# Patient Record
Sex: Female | Born: 1980 | Race: White | Hispanic: No | Marital: Married | Smoking: Current every day smoker
Health system: Southern US, Community
[De-identification: ages and names within clinical notes are randomized; demographics above are authoritative.]

## PROBLEM LIST (undated history)

## (undated) DIAGNOSIS — E119 Type 2 diabetes mellitus without complications: Secondary | ICD-10-CM

## (undated) DIAGNOSIS — T4145XA Adverse effect of unspecified anesthetic, initial encounter: Secondary | ICD-10-CM

## (undated) DIAGNOSIS — F419 Anxiety disorder, unspecified: Secondary | ICD-10-CM

## (undated) DIAGNOSIS — R112 Nausea with vomiting, unspecified: Secondary | ICD-10-CM

## (undated) DIAGNOSIS — T8859XA Other complications of anesthesia, initial encounter: Secondary | ICD-10-CM

## (undated) DIAGNOSIS — Z9889 Other specified postprocedural states: Secondary | ICD-10-CM

## (undated) DIAGNOSIS — E8809 Other disorders of plasma-protein metabolism, not elsewhere classified: Secondary | ICD-10-CM

## (undated) HISTORY — PX: APPENDECTOMY: SHX54

## (undated) HISTORY — PX: CHOLECYSTECTOMY: SHX55

---
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
## 2011-08-15 ENCOUNTER — Encounter (INDEPENDENT_AMBULATORY_CARE_PROVIDER_SITE_OTHER): Payer: Self-pay

## 2012-02-22 HISTORY — PX: BACK SURGERY: SHX140

## 2012-03-27 ENCOUNTER — Emergency Department: Payer: Self-pay | Admitting: Emergency Medicine

## 2012-03-27 LAB — COMPREHENSIVE METABOLIC PANEL
Albumin: 3.8 g/dL (ref 3.4–5.0)
Alkaline Phosphatase: 90 U/L (ref 50–136)
Anion Gap: 9 (ref 7–16)
BUN: 10 mg/dL (ref 7–18)
Calcium, Total: 8.9 mg/dL (ref 8.5–10.1)
Glucose: 103 mg/dL — ABNORMAL HIGH (ref 65–99)
Potassium: 3.8 mmol/L (ref 3.5–5.1)
SGOT(AST): 20 U/L (ref 15–37)
Sodium: 136 mmol/L (ref 136–145)

## 2012-03-27 LAB — LIPASE, BLOOD: Lipase: 69 U/L — ABNORMAL LOW (ref 73–393)

## 2012-03-27 LAB — CBC WITH DIFFERENTIAL/PLATELET
Basophil #: 0.1 10*3/uL (ref 0.0–0.1)
Basophil %: 0.3 %
Eosinophil #: 0.3 10*3/uL (ref 0.0–0.7)
MCV: 69 fL — ABNORMAL LOW (ref 80–100)
Neutrophil #: 22.8 10*3/uL — ABNORMAL HIGH (ref 1.4–6.5)
RDW: 17.6 % — ABNORMAL HIGH (ref 11.5–14.5)
WBC: 29.4 10*3/uL — ABNORMAL HIGH (ref 3.6–11.0)

## 2012-03-27 LAB — URINALYSIS, COMPLETE
Blood: NEGATIVE
Hyaline Cast: 24
Nitrite: NEGATIVE
Ph: 5 (ref 4.5–8.0)
RBC,UR: <1 /HPF (ref 0–5)
Specific Gravity: 1.02 (ref 1.003–1.030)

## 2012-06-01 ENCOUNTER — Emergency Department: Payer: Self-pay | Admitting: Emergency Medicine

## 2012-09-22 ENCOUNTER — Emergency Department: Payer: Self-pay | Admitting: Emergency Medicine

## 2013-04-03 ENCOUNTER — Emergency Department: Payer: Self-pay | Admitting: Internal Medicine

## 2014-02-26 ENCOUNTER — Ambulatory Visit: Payer: Self-pay | Admitting: Family Medicine

## 2014-04-15 ENCOUNTER — Ambulatory Visit: Payer: Self-pay | Admitting: Emergency Medicine

## 2014-06-11 ENCOUNTER — Emergency Department
Admit: 2014-06-11 | Disposition: A | Discharge status: Home / Self Care | Payer: Self-pay | Admitting: Emergency Medicine

## 2014-06-11 ENCOUNTER — Emergency Department: Admit: 2014-06-11 | Discharge status: Against Medical Advice | Payer: Self-pay | Admitting: Student

## 2014-06-11 LAB — URINALYSIS, COMPLETE
BILIRUBIN, UR: NEGATIVE
Glucose,UR: NEGATIVE mg/dL (ref 0–75)
Ketone: NEGATIVE
Nitrite: NEGATIVE
Ph: 6 (ref 4.5–8.0)
Protein: NEGATIVE
Specific Gravity: 1.004 (ref 1.003–1.030)

## 2014-06-11 LAB — CBC WITH DIFFERENTIAL/PLATELET
Basophil #: 0.2 10*3/uL — ABNORMAL HIGH (ref 0.0–0.1)
Basophil %: 0.6 %
EOS PCT: 0.9 %
Eosinophil #: 0.2 10*3/uL (ref 0.0–0.7)
HCT: 38.9 % (ref 35.0–47.0)
HGB: 12.3 g/dL (ref 12.0–16.0)
LYMPHS ABS: 4.8 10*3/uL — AB (ref 1.0–3.6)
Lymphocyte %: 19.3 %
MCH: 23.5 pg — AB (ref 26.0–34.0)
MCHC: 31.6 g/dL — ABNORMAL LOW (ref 32.0–36.0)
MCV: 74 fL — ABNORMAL LOW (ref 80–100)
MONO ABS: 1.4 x10 3/mm — AB (ref 0.2–0.9)
Monocyte %: 5.7 %
NEUTROS ABS: 18.3 10*3/uL — AB (ref 1.4–6.5)
Neutrophil %: 73.5 %
PLATELETS: 449 10*3/uL — AB (ref 150–440)
RBC: 5.23 10*6/uL — ABNORMAL HIGH (ref 3.80–5.20)
RDW: 19.6 % — ABNORMAL HIGH (ref 11.5–14.5)
WBC: 24.9 10*3/uL — ABNORMAL HIGH (ref 3.6–11.0)

## 2014-06-11 LAB — BASIC METABOLIC PANEL
ANION GAP: 9 (ref 7–16)
BUN: 9 mg/dL
CREATININE: 0.71 mg/dL
Calcium, Total: 9.2 mg/dL
Chloride: 107 mmol/L
Co2: 22 mmol/L
EGFR (African American): >60
EGFR (Non-African Amer.): >60
Glucose: 105 mg/dL — ABNORMAL HIGH
POTASSIUM: 3.6 mmol/L
SODIUM: 138 mmol/L

## 2014-06-11 LAB — ETHANOL: Ethanol: <5 mg/dL

## 2014-06-11 LAB — TROPONIN I

## 2014-06-12 ENCOUNTER — Observation Stay
Admit: 2014-06-12 | Disposition: A | Discharge status: Home / Self Care | Payer: Self-pay | Attending: Internal Medicine | Admitting: Internal Medicine

## 2014-06-12 LAB — URINALYSIS, COMPLETE
Bilirubin,UR: NEGATIVE
Glucose,UR: NEGATIVE mg/dL (ref 0–75)
KETONE: NEGATIVE
Leukocyte Esterase: NEGATIVE
NITRITE: NEGATIVE
PH: 5 (ref 4.5–8.0)
Protein: NEGATIVE
SPECIFIC GRAVITY: 1.013 (ref 1.003–1.030)

## 2014-06-12 LAB — CBC WITH DIFFERENTIAL/PLATELET
Basophil #: 0.1 10*3/uL (ref 0.0–0.1)
Basophil %: 0.4 %
EOS PCT: 1.2 %
Eosinophil #: 0.3 10*3/uL (ref 0.0–0.7)
HCT: 36.4 % (ref 35.0–47.0)
HGB: 11.7 g/dL — ABNORMAL LOW (ref 12.0–16.0)
LYMPHS ABS: 5.6 10*3/uL — AB (ref 1.0–3.6)
Lymphocyte %: 20.9 %
MCH: 23.8 pg — AB (ref 26.0–34.0)
MCHC: 32 g/dL (ref 32.0–36.0)
MCV: 74 fL — AB (ref 80–100)
MONO ABS: 2 x10 3/mm — AB (ref 0.2–0.9)
Monocyte %: 7.7 %
Neutrophil #: 18.5 10*3/uL — ABNORMAL HIGH (ref 1.4–6.5)
Neutrophil %: 69.8 %
PLATELETS: 476 10*3/uL — AB (ref 150–440)
RBC: 4.9 10*6/uL (ref 3.80–5.20)
RDW: 19.5 % — AB (ref 11.5–14.5)
WBC: 26.5 10*3/uL — ABNORMAL HIGH (ref 3.6–11.0)

## 2014-06-12 LAB — COMPREHENSIVE METABOLIC PANEL
ALK PHOS: 60 U/L
ANION GAP: 7 (ref 7–16)
Albumin: 3.6 g/dL
BILIRUBIN TOTAL: 0.4 mg/dL
BUN: 8 mg/dL
CALCIUM: 8.7 mg/dL — AB
CO2: 24 mmol/L
CREATININE: 0.78 mg/dL
Chloride: 107 mmol/L
Glucose: 94 mg/dL
Potassium: 4.1 mmol/L
SGOT(AST): 29 U/L
SGPT (ALT): 16 U/L
Sodium: 138 mmol/L
Total Protein: 7.3 g/dL

## 2014-06-12 LAB — TROPONIN I

## 2014-06-12 LAB — LIPASE, BLOOD: Lipase: 23 U/L

## 2014-06-13 LAB — CBC WITH DIFFERENTIAL/PLATELET
BASOS ABS: 0.1 10*3/uL (ref 0.0–0.1)
Basophil %: 0.5 %
EOS PCT: 1.8 %
Eosinophil #: 0.3 10*3/uL (ref 0.0–0.7)
HCT: 34.7 % — AB (ref 35.0–47.0)
HGB: 10.6 g/dL — ABNORMAL LOW (ref 12.0–16.0)
LYMPHS ABS: 4.4 10*3/uL — AB (ref 1.0–3.6)
Lymphocyte %: 28.9 %
MCH: 23 pg — AB (ref 26.0–34.0)
MCHC: 30.5 g/dL — ABNORMAL LOW (ref 32.0–36.0)
MCV: 75 fL — AB (ref 80–100)
MONO ABS: 1.3 x10 3/mm — AB (ref 0.2–0.9)
Monocyte %: 8.3 %
NEUTROS ABS: 9.3 10*3/uL — AB (ref 1.4–6.5)
Neutrophil %: 60.5 %
PLATELETS: 360 10*3/uL (ref 150–440)
RBC: 4.6 10*6/uL (ref 3.80–5.20)
RDW: 19.4 % — ABNORMAL HIGH (ref 11.5–14.5)
WBC: 15.3 10*3/uL — ABNORMAL HIGH (ref 3.6–11.0)

## 2014-06-16 LAB — STOOL CULTURE

## 2014-06-16 LAB — CLOSTRIDIUM DIFFICILE(ARMC)

## 2014-06-19 ENCOUNTER — Emergency Department: Admit: 2014-06-19 | Discharge status: Against Medical Advice | Payer: Self-pay | Admitting: Student

## 2014-06-19 LAB — CBC WITH DIFFERENTIAL/PLATELET
Basophil #: 0.1 10*3/uL (ref 0.0–0.1)
Basophil %: 0.5 %
EOS ABS: 0.2 10*3/uL (ref 0.0–0.7)
EOS PCT: 1 %
HCT: 35 % (ref 35.0–47.0)
HGB: 11 g/dL — AB (ref 12.0–16.0)
LYMPHS PCT: 17.2 %
Lymphocyte #: 3.8 10*3/uL — ABNORMAL HIGH (ref 1.0–3.6)
MCH: 23.5 pg — ABNORMAL LOW (ref 26.0–34.0)
MCHC: 31.5 g/dL — AB (ref 32.0–36.0)
MCV: 75 fL — ABNORMAL LOW (ref 80–100)
Monocyte #: 1.3 x10 3/mm — ABNORMAL HIGH (ref 0.2–0.9)
Monocyte %: 5.9 %
NEUTROS ABS: 16.7 10*3/uL — AB (ref 1.4–6.5)
NEUTROS PCT: 75.4 %
Platelet: 356 10*3/uL (ref 150–440)
RBC: 4.69 10*6/uL (ref 3.80–5.20)
RDW: 19.3 % — ABNORMAL HIGH (ref 11.5–14.5)
WBC: 22.1 10*3/uL — AB (ref 3.6–11.0)

## 2014-06-19 LAB — BASIC METABOLIC PANEL
Anion Gap: 6 — ABNORMAL LOW (ref 7–16)
BUN: 7 mg/dL
CALCIUM: 8.7 mg/dL — AB
CHLORIDE: 109 mmol/L
CREATININE: 0.73 mg/dL
Co2: 23 mmol/L
EGFR (African American): >60
EGFR (Non-African Amer.): >60
GLUCOSE: 110 mg/dL — AB
Potassium: 3.6 mmol/L
SODIUM: 138 mmol/L

## 2014-06-19 LAB — TROPONIN I: Troponin-I: <0.03 ng/mL

## 2014-06-21 DIAGNOSIS — R55 Syncope and collapse: Secondary | ICD-10-CM | POA: Insufficient documentation

## 2014-06-21 DIAGNOSIS — Z720 Tobacco use: Secondary | ICD-10-CM | POA: Diagnosis present

## 2014-06-21 DIAGNOSIS — R519 Headache, unspecified: Secondary | ICD-10-CM | POA: Insufficient documentation

## 2014-06-22 DIAGNOSIS — G43909 Migraine, unspecified, not intractable, without status migrainosus: Secondary | ICD-10-CM | POA: Insufficient documentation

## 2014-06-22 NOTE — Discharge Summary (Addendum)
PATIENT NAME:  Elaine [name], Elaine [name] MR#:  [id number] DATE OF BIRTH:  October 22, 1980  DATE OF ADMISSION:  06/12/2014 DATE OF DISCHARGE:  06/13/2014  PRIMARY DOCTOR: Angus PalmsSionne George, MD   CONSULTATIONS: None.  DISCHARGE DIAGNOSES: 1.  Headache.  2.  Clostridium difficile colitis with leukocytosis.   3.  Anxiety.   DISCHARGE MEDICATIONS: Acidophilus  2 tablets daily, vancomycin 120 mg in the q. 6 hours, birth control pills, ibuprofen 400 mg every 6 hours, Imitrex 25 mg every 2 hours as needed for headaches. The patient wanted Valium and also Percocet, I told her that she can follow with her primary doctor.    LABORATORY DATA: The patient's CT head unremarkable. The patient's MRI of the brain did not reveal acute stroke.   Initial white count 26.5, repeat WBC 15.3. Stool culture showed no campylobacter but Clostridium difficile was positive by PRC. The patient was started on vancomycin 120 mg p.o. every 6 hours.   HOSPITAL COURSE:  1. The patient is [name] 34 year old female admitted for headaches. CT head and MRI were normal.  2. Clostridium difficile colitis. Started on vancomycin and contact precautions. The patient was walking in the hallway despite directions and she wanted to go out and smoke as well. The patient is afebrile and no abdominal, so we decided to discharge her.   TIME SPENT: More than 35 minutes. The patient went home with vancomycin and Lactobacillus.  ____________________________ Katha HammingSnehalatha Damareon Lanni, MD sk:bm D: 06/13/2014 23:52:00 ET T: 06/14/2014 03:49:28 ET JOB#: [id number]  cc: Angus PalmsSionne George, MD   Katha HammingSNEHALATHA Lakasha Mcfall MD ELECTRONICALLY SIGNED 06/24/2014 23:23

## 2014-06-22 NOTE — H&P (Addendum)
PATIENT NAME:  Elaine [name], Elaine [name] MR#:  [id number] DATE OF BIRTH:  [birth year]  DATE OF ADMISSION:  06/12/2014  PATIENT'S PRIMARY DOCTOR:  Angus Palms, M.D.   EMERGENCY ROOM PHYSICIAN: Dr. Daryel November, M.D.   CHIEF COMPLAINT: Headache.   HISTORY OF PRESENT ILLNESS: This is a 34 year old female patient who comes in because of severe headache. She has a history of migraines and she uses Imitrex on a regular basis. She has a headache all over her head going on since Tuesday associated with some episodes of nausea. No neck pain. No fever. She denies any recent history of travel.  She complains of nausea, vomiting multiple times yesterday and today. She also has photophobia. The patient says that she gets menstrual migraines and her period was over last week and she does not think this is migraine. Her headache is different this time. The patient was seen in the Emergency Room for the headache and she seen in the Emergency Room on 04/20 and discharged home with tramadol. The patient comes back today because she says she passed out yesterday 2 time and 1 time today. Unable to tell me how much time she lost consciousness. She says that she lives by herself.  Her boyfriend is away and she is not able to tell me how long she lost consciousness. The patient complains of blurred vision, but she says she gets this blurred vision due to migraines and she says this is not new.  She says that she is under a lot of stress working 70 hours per week and not getting enough sleep. The patient asking for antianxiety medicine Ativan and refuses to take Fioricet because of the caffeine content. The patient received 4 mg of morphine in the Emergency Room and also phenergan.  She is right now crying because of her anxiety. S  PAST MEDICAL HISTORY:  Significant for migraines and history of migraines.   ALLERGIES: SHE IS ALLERGIC TO ACETYLCHOLINE,  PAST SURGICAL HISTORY:  Cholecystectomy, appendectomy.   FAMILY HISTORY:  Mother had migraines. No other family history of hypertension or diabetes.   SOCIAL HISTORY: She smokes 1 pack per day. No alcohol. No drugs.  Works at [employer] and works 70 hours a week.  They are short staffed and she says she is working a lot recently and she worked like 18 hours on Monday and her headache started on Tuesday.   MEDICATIONS AT HOME:  1.  Imitrex 25 mg every 2 hours as needed for headache; 2.  Loestrin biphasic tablets 1 tablet daily.  3. Tramadol 50 mg 2 tablets t.i.d.  She says this tramadol is helping her with headache. 4.  Ibuprofen 400 mg every 6 hours.   REVIEW OF SYSTEMS:  GENERAL:  The patient has headache.  No fever.   EARS, NOSE, AND THROAT: No ear pain. No epistaxis. No difficulty swallowing.  PULMONARY:  No shortness of breath.  GASTROINTESTINAL: The patient has nausea, vomiting, and like loose stools today and yesterday. Denies like diarrhea. CARDIOVASCULAR: No chest pain.  NEUROLOGIC: Complains of anxiety. Has history of migraines.  PSYCHIATRIC: Anxiety and she is under stress.  GENITOURINARY: No dysuria.  MUSCULOSKELETAL: Denies any joint pains.   PHYSICAL EXAMINATION:  VITAL SIGNS: Temperature 97.5 Fahrenheit, heart rate 71, blood pressure 150/86, saturation 96% on room air.  GENERAL:  The patient is alert, awake, oriented, 34 year old, obese female having slight anxiety and complains of severe headache.  HEAD: Atraumatic, normocephalic. Does complain of tenderness all over her head.  EARS, NOSE, AND THROAT: No tympanic membrane congestion. No turbinate hypertrophy. No oropharyngeal edema.  NECK: Supple.  No JVD. No carotid bruit. No lymphadenopathy.  CARDIOVASCULAR: S1, S2 regular. No murmurs. The patient's PMI is not displaced. Peripheral pulses are equal in carotid, femoral, and dorsalis pedis. No extremity edema. PULMONARY:  Lungs are clear to auscultation. No wheeze. No rales. Not using accessory muscles of respiration.  GASTROINTESTINAL:  Abdomen is soft, nontender, nondistended. Bowel sounds present. The patient has no on no hernias. No CVA tenderness.  GENITOURINARY:  Deferred.   MUSCULOSKELETAL: Able to move all extremities. No cyanosis, no clubbing, no joint effusion.  NEUROLOGIC: Alert, awake, oriented. Cranial nerves II through XII intact. Power 5/5 in upper and lower extremities.  Sensations are intact.  DTRs 2+ bilaterally.  PSYCHIATRIC: The patient is very anxious and she is requesting Ativan for her anxiety.  LABORATORY DATA: White count 26.5, hemoglobin 11.7, hematocrit 26.4, platelets 476,000.   ELECTROLYTES: Sodium 138, potassium 4.1, chloride 107, bicarbonate 24, BUN 8, creatinine 0.78, glucose 94.   HEAD CT:  Shows negative noncontrast CT of the brain.  No acute infarct. No hemorrhaged.  The patient had a CT abdomen with contrast done yesterday which showed essentially normal. No renal stones, hepatic steatosis is suspected.  White count yesterday 24.9.  ASSESSMENT AND PLAN:   1001.  A 34 year old female patient with a severe headache unusual from her migraines and she is placed on observation status because of headache, nausea, vomiting, and leukocytosis.  Her headache at this time sounds like a lot of stress headache due to her working long hours and insomnia.  We will give her Valium 5 mg t.i.d. and continue Fioricet and follow the MRI of the brain to evaluate for any structural abnormalities. If she continues to have headache, will get a neurology consult.  2.  Leukocytosis likely secondary to dehydration.  The patient having some nausea, vomiting, and not eating well so continue fluids and check CBC. The patient has no fever. No meningismus.  No indication for lumbar puncture at this time and hold off on the antibiotics. 3.  Anxiety. The patient is already on Valium and Fioricet and if needed we can give small dose of Xanax 0.5 mg b.i.d.  4.  History of tobacco abuse. Counseled against smoking and offered assistance  and counseled for about 3 minutes and start nicotine patch at 40 mg daily.   TIME SPENT: About 55 minutes.   ___________________________ Katha Hamming_ Maxima Skelton, MD sk:sp D: 06/12/2014 13:19:11 ET T: 06/12/2014 13:38:17 ET JOB#: [id number]  cc: Katha HammingSnehalatha Mehak Roskelley, MD, <Dictator> Angus PalmsSionne George, MD   Katha HammingSNEHALATHA Deshawnda Acrey MD ELECTRONICALLY SIGNED 06/24/2014 22:12

## 2014-07-04 ENCOUNTER — Ambulatory Visit: Payer: Self-pay | Admitting: Oncology

## 2014-07-25 ENCOUNTER — Ambulatory Visit: Payer: Self-pay | Admitting: Oncology

## 2014-08-08 ENCOUNTER — Ambulatory Visit: Payer: Self-pay | Admitting: Oncology

## 2014-09-12 ENCOUNTER — Ambulatory Visit: Payer: Self-pay | Admitting: Oncology

## 2015-01-15 ENCOUNTER — Encounter: Payer: Self-pay | Admitting: Emergency Medicine

## 2015-01-15 ENCOUNTER — Emergency Department
Admission: EM | Admit: 2015-01-15 | Discharge: 2015-01-15 | Discharge status: Against Medical Advice | Payer: BLUE CROSS/BLUE SHIELD | Attending: Emergency Medicine | Admitting: Emergency Medicine

## 2015-01-15 ENCOUNTER — Emergency Department: Payer: BLUE CROSS/BLUE SHIELD

## 2015-01-15 DIAGNOSIS — Z3202 Encounter for pregnancy test, result negative: Secondary | ICD-10-CM | POA: Insufficient documentation

## 2015-01-15 DIAGNOSIS — R079 Chest pain, unspecified: Secondary | ICD-10-CM | POA: Diagnosis not present

## 2015-01-15 DIAGNOSIS — R0602 Shortness of breath: Secondary | ICD-10-CM | POA: Insufficient documentation

## 2015-01-15 DIAGNOSIS — F17210 Nicotine dependence, cigarettes, uncomplicated: Secondary | ICD-10-CM | POA: Insufficient documentation

## 2015-01-15 DIAGNOSIS — M25512 Pain in left shoulder: Secondary | ICD-10-CM | POA: Diagnosis not present

## 2015-01-15 HISTORY — DX: Other disorders of plasma-protein metabolism, not elsewhere classified: E88.09

## 2015-01-15 LAB — CBC WITH DIFFERENTIAL/PLATELET
BASOS ABS: 0.1 10*3/uL (ref 0–0.1)
Basophils Relative: 0 %
EOS ABS: 0.4 10*3/uL (ref 0–0.7)
Eosinophils Relative: 2 %
HCT: 40.3 % (ref 35.0–47.0)
Hemoglobin: 12.9 g/dL (ref 12.0–16.0)
Lymphocytes Relative: 20 %
Lymphs Abs: 4.6 10*3/uL — ABNORMAL HIGH (ref 1.0–3.6)
MCH: 25.4 pg — ABNORMAL LOW (ref 26.0–34.0)
MCHC: 32.1 g/dL (ref 32.0–36.0)
MCV: 78.9 fL — ABNORMAL LOW (ref 80.0–100.0)
MONO ABS: 1.6 10*3/uL — AB (ref 0.2–0.9)
MONOS PCT: 7 %
NEUTROS PCT: 71 %
Neutro Abs: 16.3 10*3/uL — ABNORMAL HIGH (ref 1.4–6.5)
Platelets: 390 10*3/uL (ref 150–440)
RBC: 5.1 MIL/uL (ref 3.80–5.20)
RDW: 17.1 % — ABNORMAL HIGH (ref 11.5–14.5)
WBC: 22.9 10*3/uL — ABNORMAL HIGH (ref 3.6–11.0)

## 2015-01-15 LAB — COMPREHENSIVE METABOLIC PANEL
ALT: 13 U/L — ABNORMAL LOW (ref 14–54)
ANION GAP: 13 (ref 5–15)
AST: 18 U/L (ref 15–41)
Albumin: 4.1 g/dL (ref 3.5–5.0)
Alkaline Phosphatase: 82 U/L (ref 38–126)
BUN: 11 mg/dL (ref 6–20)
CHLORIDE: 105 mmol/L (ref 101–111)
CO2: 21 mmol/L — AB (ref 22–32)
Calcium: 9.7 mg/dL (ref 8.9–10.3)
Creatinine, Ser: 0.89 mg/dL (ref 0.44–1.00)
GFR calc Af Amer: >60 mL/min (ref >60)
GFR calc non Af Amer: >60 mL/min (ref >60)
Glucose, Bld: 124 mg/dL — ABNORMAL HIGH (ref 65–99)
Potassium: 3.4 mmol/L — ABNORMAL LOW (ref 3.5–5.1)
SODIUM: 139 mmol/L (ref 135–145)
Total Bilirubin: 0.3 mg/dL (ref 0.3–1.2)
Total Protein: 7.9 g/dL (ref 6.5–8.1)

## 2015-01-15 LAB — URINALYSIS COMPLETE WITH MICROSCOPIC (ARMC ONLY)
Bacteria, UA: NONE SEEN
Bilirubin Urine: NEGATIVE
Glucose, UA: NEGATIVE mg/dL
Leukocytes, UA: NEGATIVE
Nitrite: NEGATIVE
Protein, ur: NEGATIVE mg/dL
Specific Gravity, Urine: 1.005 (ref 1.005–1.030)
pH: 6 (ref 5.0–8.0)

## 2015-01-15 LAB — LIPASE, BLOOD: Lipase: 25 U/L (ref 11–51)

## 2015-01-15 LAB — TSH: TSH: 2.448 u[IU]/mL (ref 0.350–4.500)

## 2015-01-15 LAB — FIBRIN DERIVATIVES D-DIMER (ARMC ONLY): FIBRIN DERIVATIVES D-DIMER (ARMC): 220 (ref 0–499)

## 2015-01-15 LAB — TROPONIN I: Troponin I: 0.03 ng/mL (ref <0.031)

## 2015-01-15 LAB — POCT PREGNANCY, URINE: PREG TEST UR: NEGATIVE

## 2015-01-15 MED ORDER — LORAZEPAM 2 MG/ML IJ SOLN
1.0000 mg | Freq: Once | INTRAMUSCULAR | Status: AC
  Administered 2015-01-15: 1 mg via INTRAVENOUS
  Filled 2015-01-15: qty 1

## 2015-01-15 NOTE — Discharge Instructions (Signed)
Nonspecific Chest Pain  °Chest pain can be caused by many different conditions. There is always a chance that your pain could be related to something serious, such as a heart attack or a blood clot in your lungs. Chest pain can also be caused by conditions that are not life-threatening. If you have chest pain, it is very important to follow up with your health care provider. °CAUSES  °Chest pain can be caused by: °· Heartburn. °· Pneumonia or bronchitis. °· Anxiety or stress. °· Inflammation around your heart (pericarditis) or lung (pleuritis or pleurisy). °· A blood clot in your lung. °· A collapsed lung (pneumothorax). It can develop suddenly on its own (spontaneous pneumothorax) or from trauma to the chest. °· Shingles infection (varicella-zoster virus). °· Heart attack. °· Damage to the bones, muscles, and cartilage that make up your chest wall. This can include: °¨ Bruised bones due to injury. °¨ Strained muscles or cartilage due to frequent or repeated coughing or overwork. °¨ Fracture to one or more ribs. °¨ Sore cartilage due to inflammation (costochondritis). °RISK FACTORS  °Risk factors for chest pain may include: °· Activities that increase your risk for trauma or injury to your chest. °· Respiratory infections or conditions that cause frequent coughing. °· Medical conditions or overeating that can cause heartburn. °· Heart disease or family history of heart disease. °· Conditions or health behaviors that increase your risk of developing a blood clot. °· Having had chicken pox (varicella zoster). °SIGNS AND SYMPTOMS °Chest pain can feel like: °· Burning or tingling on the surface of your chest or deep in your chest. °· Crushing, pressure, aching, or squeezing pain. °· Dull or sharp pain that is worse when you move, cough, or take a deep breath. °· Pain that is also felt in your back, neck, shoulder, or arm, or pain that spreads to any of these areas. °Your chest pain may come and go, or it may stay  constant. °DIAGNOSIS °Lab tests or other studies may be needed to find the cause of your pain. Your health care provider may have you take a test called an ambulatory ECG (electrocardiogram). An ECG records your heartbeat patterns at the time the test is performed. You may also have other tests, such as: °· Transthoracic echocardiogram (TTE). During echocardiography, sound waves are used to create a picture of all of the heart structures and to look at how blood flows through your heart. °· Transesophageal echocardiogram (TEE). This is a more advanced imaging test that obtains images from inside your body. It allows your health care provider to see your heart in finer detail. °· Cardiac monitoring. This allows your health care provider to monitor your heart rate and rhythm in real time. °· Holter monitor. This is a portable device that records your heartbeat and can help to diagnose abnormal heartbeats. It allows your health care provider to track your heart activity for several days, if needed. °· Stress tests. These can be done through exercise or by taking medicine that makes your heart beat more quickly. °· Blood tests. °· Imaging tests. °TREATMENT  °Your treatment depends on what is causing your chest pain. Treatment may include: °· Medicines. These may include: °¨ Acid blockers for heartburn. °¨ Anti-inflammatory medicine. °¨ Pain medicine for inflammatory conditions. °¨ Antibiotic medicine, if an infection is present. °¨ Medicines to dissolve blood clots. °¨ Medicines to treat coronary artery disease. °· Supportive care for conditions that do not require medicines. This may include: °¨ Resting. °¨ Applying heat   or cold packs to injured areas. °¨ Limiting activities until pain decreases. °HOME CARE INSTRUCTIONS °· If you were prescribed an antibiotic medicine, finish it all even if you start to feel better. °· Avoid any activities that bring on chest pain. °· Do not use any tobacco products, including  cigarettes, chewing tobacco, or electronic cigarettes. If you need help quitting, ask your health care provider. °· Do not drink alcohol. °· Take medicines only as directed by your health care provider. °· Keep all follow-up visits as directed by your health care provider. This is important. This includes any further testing if your chest pain does not go away. °· If heartburn is the cause for your chest pain, you may be told to keep your head raised (elevated) while sleeping. This reduces the chance that acid will go from your stomach into your esophagus. °· Make lifestyle changes as directed by your health care provider. These may include: °¨ Getting regular exercise. Ask your health care provider to suggest some activities that are safe for you. °¨ Eating a heart-healthy diet. A registered dietitian can help you to learn healthy eating options. °¨ Maintaining a healthy weight. °¨ Managing diabetes, if necessary. °¨ Reducing stress. °SEEK MEDICAL CARE IF: °· Your chest pain does not go away after treatment. °· You have a rash with blisters on your chest. °· You have a fever. °SEEK IMMEDIATE MEDICAL CARE IF:  °· Your chest pain is worse. °· You have an increasing cough, or you cough up blood. °· You have severe abdominal pain. °· You have severe weakness. °· You faint. °· You have chills. °· You have sudden, unexplained chest discomfort. °· You have sudden, unexplained discomfort in your arms, back, neck, or jaw. °· You have shortness of breath at any time. °· You suddenly start to sweat, or your skin gets clammy. °· You feel nauseous or you vomit. °· You suddenly feel light-headed or dizzy. °· Your heart begins to beat quickly, or it feels like it is skipping beats. °These symptoms may represent a serious problem that is an emergency. Do not wait to see if the symptoms will go away. Get medical help right away. Call your local emergency services (911 in the [place] Do not drive yourself to the hospital. °  °This  information is not intended to replace advice given to you by your health care provider. Make sure you discuss any questions you have with your health care provider. °  °Document Released: 11/17/2004 Document Revised: 02/28/2014 Document Reviewed: 09/13/2013 °Elsevier Interactive Patient Education ©2016 [employer] ° °Please return immediately if condition worsens. Please contact her primary physician or the physician you were given for referral. If you have any specialist physicians involved in her treatment and plan please also contact them. Thank you for using [place] [facility]. ° °

## 2015-01-15 NOTE — ED Notes (Signed)
C/o left anterior chest pain and shortness of breath.  Describes pain as constant sharp pressure.  Denies cardiac history.

## 2015-01-15 NOTE — ED Provider Notes (Signed)
Time Seen: Approximately.2100  I have reviewed the triage notes  Chief Complaint: Chest Pain and Shortness of Breath   History of Present Illness: Elaine [name] is a 34 y.o. female who presents with left-sided chest discomfort is started approximately 2 hours prior to arrival. She denies any radiation discomfort though states it does seem to show up slightly on the right side with some heart palpitations. She describes some mild shortness of breath and no pleuritic or positional component. Patient states she was doing her normal day-to-day activities when the discomfort started. She denies any vomiting though did have some mild nausea. She denies any fever or chills or productive cough. He denies any calf tenderness or swelling. She denies any history of pulmonary embolism or blood clots. She denies any exacerbation by movement. She denies any reflux-type symptoms. Past Medical History  Diagnosis Date  . Pseudocholinesterase deficiency     There are no active problems to display for this patient.   Past Surgical History  Procedure Laterality Date  . Cholecystectomy    . Appendectomy      Past Surgical History  Procedure Laterality Date  . Cholecystectomy    . Appendectomy      No current outpatient prescriptions on file.  Allergies:  Succinylcholine  Family History: No family history on file. No early cardiovascular disease. Social History: Social History  Substance Use Topics  . Smoking status: Current Every Day Smoker -- 1.00 packs/day    Types: Cigarettes  . Smokeless tobacco: Never Used  . Alcohol Use: Yes     Comment: socially     Review of Systems:   10 point review of systems was performed and was otherwise negative:  Constitutional: No fever Eyes: No visual disturbances ENT: No sore throat, ear pain Cardiac: Chest pain still present during evaluation. She states some possible mild radiation toward the left shoulder region Respiratory: No  shortness of breath, wheezing, or stridor Abdomen: No abdominal pain, no vomiting, No diarrhea Endocrine: No weight loss, No night sweats Extremities: No peripheral edema, cyanosis Skin: No rashes, easy bruising Neurologic: No focal weakness, trouble with speech or swollowing Urologic: No dysuria, Hematuria, or urinary frequency  Physical Exam:  ED Triage Vitals  Enc Vitals Group     BP 01/15/15 2047 159/101 mmHg     Pulse Rate 01/15/15 2047 120     Resp 01/15/15 2047 10     Temp --      Temp src --      SpO2 01/15/15 2047 95 %     Weight --      Height --      Head Cir --      Peak Flow --      Pain Score 01/15/15 2038 7     Pain Loc --      Pain Edu? --      Excl. in GC? --     General: Awake , Alert , and Oriented times 3; GCS 15 Head: Normal cephalic , atraumatic Eyes: Pupils equal , round, reactive to light Nose/Throat: No nasal drainage, patent upper airway without erythema or exudate.  Neck: Supple, Full range of motion, No anterior adenopathy or palpable thyroid masses Lungs: Clear to ascultation without wheezes , rhonchi, or rales Heart: Regular rate, regular rhythm without murmurs , gallops , or rubs Abdomen: Soft, non tender without rebound, guarding , or rigidity; bowel sounds positive and symmetric in all 4 quadrants. No organomegaly .  Extremities: 2 plus symmetric pulses. No edema, clubbing or cyanosis Neurologic: normal ambulation, Motor symmetric without deficits, sensory intact Skin: warm, dry, no rashes   Labs:   All laboratory work was reviewed including any pertinent negatives or positives listed below:  Labs Reviewed  CBC WITH DIFFERENTIAL/PLATELET  COMPREHENSIVE METABOLIC PANEL  LIPASE, BLOOD  TROPONIN I  FIBRIN DERIVATIVES D-DIMER ([facility] ONLY)    EKG: ED ECG REPORT I, Jennye MoccasinBrian S Kimesha Claxton, the attending physician, personally viewed and interpreted this ECG.  Date: 01/15/2015 EKG Time: 2038 Rate: 111 Rhythm: normal sinus rhythm QRS  Axis: normal Intervals: normal ST/T Wave abnormalities: normal Conduction Disutrbances: none Narrative Interpretation: unremarkable    Radiology:      I personally reviewed the radiologic studies CLINICAL DATA: Left anterior chest pain and shortness of breath with nausea today. Smoker.  EXAM: CHEST 2 VIEW  COMPARISON: None.  FINDINGS: The heart size and mediastinal contours are within normal limits. Both lungs are clear. The visualized skeletal structures are unremarkable.  IMPRESSION: No active cardiopulmonary disease.    ED Course:  Differential includes all life-threatening causes for chest pain. This includes but is not exclusive to acute coronary syndrome, aortic dissection, pulmonary embolism, cardiac tamponade, community-acquired pneumonia, pericarditis, musculoskeletal chest wall pain, etc.  Thus far the patient has remained tachycardic but otherwise is hemodynamically stable. Review of her laboratory work showed an elevated white blood cell count and urine and urine pregnancy test was ordered. Her D-dimer test was negative though if her persistent tachycardia without patient may require CT evaluation. I checked out the case to my colleague Dr. Manson PasseyBrown who is agreed to discuss with the patient staying further for repeat troponin and also the possibility of a chest CAT scan.   Assessment: Acute unspecified chest pain     Plan:  Bedside discussion with Dr. Manson PasseyBrown with disposition pending          Jennye MoccasinBrian S Jaslyne Beeck, MD 01/16/15 [phone]

## 2015-01-15 NOTE — ED Notes (Signed)
Patient removed IV and stated " I would like to go".  Patient declined repeat troponin and has decided she would not like any further workup.  Informed her I would have the physician come speak with her first and patient verbalized understanding.

## 2015-01-15 NOTE — ED Provider Notes (Signed)
I assumed care of the patient 11:00 PM from Dr. Huel CoteQuigley. Plan was for the patient to have a CT angiogram of the chest to evaluate for pulmonary emboli as well as a repeat troponin. However patient requested to leave AGAINST MEDICAL ADVICE I spoke to the patient at length informing her of the considerable risk of death or permanent disability secondary to her decision. In addition I informed the patient of her persistently elevated white blood cell count of which she was aware and referred to  hematology however she stated that she did not keep that appointment. Encourage the patient again and informed her of the potential risk of not following up with hematology in addition to the potential grave risk associated with her decision today to leave AGAINST MEDICAL ADVICE however the patient is adamant in regards to leaving. She states that she believes that her symptoms are secondary to just anxiety/panic attack. I told her the possibility that this could indeed be a pulmonary emboli or of cardiac etiology however the patient again was adamant regarding leaving AGAINST MEDICAL ADVICE.  Darci Currentandolph N Cono Gebhard, MD 01/15/15 [phone]

## 2015-01-15 NOTE — ED Notes (Signed)
MD at bedside. 

## 2015-02-25 ENCOUNTER — Ambulatory Visit
Admission: RE | Admit: 2015-02-25 | Discharge: 2015-02-25 | Disposition: A | Discharge status: Home / Self Care | Payer: BLUE CROSS/BLUE SHIELD | Source: Ambulatory Visit | Attending: Family Medicine | Admitting: Family Medicine

## 2015-02-25 ENCOUNTER — Other Ambulatory Visit: Payer: Self-pay | Admitting: Family Medicine

## 2015-02-25 DIAGNOSIS — N10 Acute tubulo-interstitial nephritis: Secondary | ICD-10-CM

## 2015-02-25 DIAGNOSIS — N1330 Unspecified hydronephrosis: Secondary | ICD-10-CM | POA: Insufficient documentation

## 2015-03-05 ENCOUNTER — Other Ambulatory Visit: Payer: Self-pay | Admitting: Family Medicine

## 2015-03-05 ENCOUNTER — Ambulatory Visit
Admission: RE | Admit: 2015-03-05 | Discharge: 2015-03-05 | Disposition: A | Discharge status: Home / Self Care | Payer: BLUE CROSS/BLUE SHIELD | Source: Ambulatory Visit | Attending: Family Medicine | Admitting: Family Medicine

## 2015-03-05 DIAGNOSIS — R319 Hematuria, unspecified: Secondary | ICD-10-CM | POA: Insufficient documentation

## 2015-03-05 DIAGNOSIS — N201 Calculus of ureter: Secondary | ICD-10-CM | POA: Diagnosis not present

## 2015-03-05 DIAGNOSIS — N1330 Unspecified hydronephrosis: Secondary | ICD-10-CM | POA: Insufficient documentation

## 2015-03-05 DIAGNOSIS — N10 Acute tubulo-interstitial nephritis: Secondary | ICD-10-CM

## 2015-03-05 DIAGNOSIS — Z87442 Personal history of urinary calculi: Secondary | ICD-10-CM | POA: Diagnosis present

## 2015-03-05 MED ORDER — IOHEXOL 350 MG/ML SOLN
125.0000 mL | Freq: Once | INTRAVENOUS | Status: AC | PRN
  Administered 2015-03-05: 125 mL via INTRAVENOUS

## 2015-09-28 ENCOUNTER — Encounter
Admission: RE | Admit: 2015-09-28 | Discharge: 2015-09-28 | Disposition: A | Discharge status: Home / Self Care | Payer: BLUE CROSS/BLUE SHIELD | Source: Ambulatory Visit | Attending: Obstetrics and Gynecology | Admitting: Obstetrics and Gynecology

## 2015-09-28 DIAGNOSIS — Z01810 Encounter for preprocedural cardiovascular examination: Secondary | ICD-10-CM | POA: Insufficient documentation

## 2015-09-28 DIAGNOSIS — Z01812 Encounter for preprocedural laboratory examination: Secondary | ICD-10-CM | POA: Insufficient documentation

## 2015-09-28 DIAGNOSIS — R7989 Other specified abnormal findings of blood chemistry: Secondary | ICD-10-CM | POA: Diagnosis not present

## 2015-09-28 HISTORY — DX: Other specified postprocedural states: Z98.890

## 2015-09-28 HISTORY — DX: Nausea with vomiting, unspecified: R11.2

## 2015-09-28 HISTORY — DX: Adverse effect of unspecified anesthetic, initial encounter: T41.45XA

## 2015-09-28 HISTORY — DX: Other complications of anesthesia, initial encounter: T88.59XA

## 2015-09-28 HISTORY — DX: Anxiety disorder, unspecified: F41.9

## 2015-09-28 LAB — BASIC METABOLIC PANEL
ANION GAP: 9 (ref 5–15)
BUN: 9 mg/dL (ref 6–20)
CALCIUM: 9.1 mg/dL (ref 8.9–10.3)
CO2: 23 mmol/L (ref 22–32)
CREATININE: 0.7 mg/dL (ref 0.44–1.00)
Chloride: 105 mmol/L (ref 101–111)
GFR calc Af Amer: >60 mL/min (ref >60)
GLUCOSE: 90 mg/dL (ref 65–99)
Potassium: 3.8 mmol/L (ref 3.5–5.1)
Sodium: 137 mmol/L (ref 135–145)

## 2015-09-28 LAB — TYPE AND SCREEN
ABO/RH(D): O POS
Antibody Screen: NEGATIVE

## 2015-09-28 LAB — CBC
HCT: 38 % (ref 35.0–47.0)
HEMOGLOBIN: 12.1 g/dL (ref 12.0–16.0)
MCH: 24.3 pg — ABNORMAL LOW (ref 26.0–34.0)
MCHC: 31.9 g/dL — ABNORMAL LOW (ref 32.0–36.0)
MCV: 76.2 fL — AB (ref 80.0–100.0)
Platelets: 334 10*3/uL (ref 150–440)
RBC: 4.98 MIL/uL (ref 3.80–5.20)
RDW: 16.5 % — ABNORMAL HIGH (ref 11.5–14.5)
WBC: 25.9 10*3/uL — ABNORMAL HIGH (ref 3.6–11.0)

## 2015-09-28 NOTE — H&P (Signed)
Patient ID: Elaine [name] Quant is a 35 y.o. female presenting with Pre Op Consulting  on 09/28/2015  HPI: 35yo G0 with AUB, resistant to medical management. She is a smoker. She has tried OCPs, Depo and IUD in the past without relief. She is bleeding almost daily.  EMBx wnl 06/18/15 Pap smear wnl 02/2014 - neg with neg HPV  06/18/15 Ultrasound wnl:  Ut wnl  Rt ov wnl Lt simple ov cyst= 1.19 cm  She requests definitive therapy.   She also is concerned about SUI but does not want to transfer to urogyn for sling.  Past Medical History:  has a past medical history of Back pain; Herniated lumbar intervertebral disc; History of pneumonia; Migraine headache; Obesity; and Spinal curvature.  Past Surgical History:  has a past surgical history that includes Appendectomy and Cholecystectomy. Family History: family history includes Breast cancer in her paternal grandmother. Social History:  reports that she has been smoking Cigarettes.  She has a 1.50 pack-year smoking history. She has never used smokeless tobacco. She reports that she does not drink alcohol or use illicit drugs. OB/GYN History:  OB History    Gravida Para Term Preterm AB Living   0 0 0 0 0 0   SAB TAB Ectopic Multiple Live Births   0 0 0 0       Allergies: is allergic to succinylcholine and succinimides. Medications:  Current Outpatient Prescriptions:  .  buPROPion (WELLBUTRIN XL) 150 MG XL tablet, 1 po daily for 1 week, then 2 po daily (Patient not taking: Reported on 09/28/2015 ), Disp: 30 tablet, Rfl: 0 .  LORazepam (ATIVAN) 0.5 MG tablet, Take 1 tablet (0.5 mg total) by mouth every 8 (eight) hours as needed for Anxiety for up to 10 days., Disp: 12 tablet, Rfl: 0 .  multivitamin tablet, Take 1 tablet by mouth once daily., Disp: , Rfl:    Review of Systems: No SOB, no palpitations or chest pain, no new lower extremity edema, no nausea or vomiting or bowel or bladder complaints. See HPI for gyn specific  ROS.   Exam:     BP (P) 116/77  Pulse (P) 106  Ht (P) 170.2 cm (5\' 7" )  Wt (!) (P) 108.5 kg (239 lb 3.2 oz)  LMP 09/14/2015 (Approximate)  BMI (P) 37.46 kg/m2  General: Patient is well-groomed, well-nourished, appears stated age in no acute distress  HEENT: head is atraumatic and normocephalic, trachea is midline, neck is supple with no palpable nodules  CV: Regular rhythm and normal heart rate, no murmur  Pulm: Clear to auscultation throughout lung fields with no wheezing, crackles, or rhonchi. No increased work of breathing, decreased breath sounds in LL lobe.  Abdomen: soft , no mass, non-tender, no rebound tenderness, no hepatomegaly  Pelvic:  Pelvic:                        External genitalia: vulva and labia without lesions, Tanner stage 5                       Urethra: no prolapse                       Vagina: normal physiologic d/c                       Cervix: no lesions, no cervical motion tenderness  Uterus: normal size shape and contour, non-tender                       Adnexa: no mass,  non-tender                         Rectovaginal: external exam normal   Impression:   The encounter diagnosis was Abnormal uterine bleeding (AUB).    Plan:    Patient returns for a preoperative discussion regarding her plans to proceed with surgical treatment of her AUB by total laparoscopic hysterectomy with bilateral salpingectomy  procedure. We will perform a cystoscopy to evaluate the urinary tract after the procedure. If there are too many adhesions, she is aware and has consented for laparotomy.  The patient and I discussed the technical aspects of the procedure including the potential for risks and complications. These include but are not limited to the risk of infection requiring post-operative antibiotics or further procedures. We talked about the risk of injury to adjacent organs including bladder, bowel, ureter, blood  vessels or nerves. We talked about the need to convert to an open incision. We talked about the possible need for blood transfusion. We talked aboutpostop complications such asthromboembolic or cardiopulmonary complications. All of her questions were answered.  Her preoperative exam was completed and the appropriate consents were signed. She is scheduled to undergo this procedure in the near future.  Specific questions today:  - Anxiety about the surgery: ativan 0.5 - No weight discussed with partner present - Cervix- will remove - beach in September - Would like to try pessary for SUI- urogyn referral not interested at this time. Will address once healing finishes and will plan for pelvic floor physical therapy in a couple of months - Lifts 45# at work- consider delayed return to work 6-8 weeks for vaginal cuff healing  [place]  Specific Peri-operative Considerations:  - Consent: obtained today - Health Maintenance: uptodate - Labs: CBC, CMP preoperatively - Studies: EKG, CXR preoperatively - Bowel Preparation: None required - Abx:  Cefoxitin 3 g - VTE ppx: SCDs perioperatively - Glucose Protocol: n//a - Beta-blockade: n/a   Return in about 4 weeks (around 10/26/2015) for Postop check.

## 2015-09-28 NOTE — Pre-Procedure Instructions (Signed)
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

## 2015-09-28 NOTE — Patient Instructions (Signed)
  Your procedure is scheduled ZO:XWRUEAon:Friday October 09, 2015. Report to Same Day Surgery. To find out your arrival time please call [phone] between 1PM - 3PM on Thursday October 08, 2015 .  Remember: Instructions that are not followed completely may result in serious medical risk, up to and including death, or upon the discretion of your surgeon and anesthesiologist your surgery may need to be rescheduled.    _x___ 1. Do not eat food or drink liquids after midnight. No gum chewing or hard candies.     _x___ 2. No Alcohol for 24 hours before or after surgery.   ____ 3. Bring all medications with you on the day of surgery if instructed.    __x__ 4. Notify your doctor if there is any change in your medical condition     (cold, fever, infections).   ___x_ 5. No smoking 24 hours prior to surgery.     Do not wear jewelry, make-up, hairpins, clips or nail polish.  Do not wear lotions, powders, or perfumes. You may wear deodorant.  Do not shave 48 hours prior to surgery. Men may shave face and neck.  Do not bring valuables to the hospital.    [facility] any belongings or valuables.               Contacts, dentures or bridgework may not be worn into surgery.  Leave your suitcase in the car. After surgery it may be brought to your room.  For patients admitted to the hospital, discharge time is determined by your treatment team.   Patients discharged the day of surgery will not be allowed to drive home.    Please read over the following fact sheets that you were given:   [facility]  ____ Take these medicines the morning of surgery with A SIP OF WATER: None     ____ Fleet Enema (as directed)   _x___ Use CHG Soap as directed on instruction sheet  ____ Use inhalers on the day of surgery and bring to hospital day of surgery  ____ Stop metformin 2 days prior to surgery    ____ Take 1/2 of usual insulin dose the night before surgery and none  on the morning of surgery.   ____ Stop Coumadin/Plavix/aspirin on does not apply.  _x__ Stop Anti-inflammatories such as Advil, Aleve, Ibuprofen, Motrin, Naproxen,  Naprosyn, Goodies powders or aspirin products. OK to take Tylenol.   ____ Stop supplements until after surgery.    ____ Bring C-Pap to the hospital.

## 2015-10-09 ENCOUNTER — Encounter: Payer: Self-pay | Admitting: *Deleted

## 2015-10-09 ENCOUNTER — Ambulatory Visit: Payer: BLUE CROSS/BLUE SHIELD | Admitting: Certified Registered"

## 2015-10-09 ENCOUNTER — Observation Stay
Admission: RE | Admit: 2015-10-09 | Discharge: 2015-10-09 | Disposition: A | Discharge status: Home / Self Care | Payer: BLUE CROSS/BLUE SHIELD | Source: Ambulatory Visit | Attending: Obstetrics and Gynecology | Admitting: Obstetrics and Gynecology

## 2015-10-09 ENCOUNTER — Encounter
Admission: RE | Disposition: A | Discharge status: Home / Self Care | Payer: Self-pay | Source: Ambulatory Visit | Attending: Obstetrics and Gynecology

## 2015-10-09 DIAGNOSIS — F17210 Nicotine dependence, cigarettes, uncomplicated: Secondary | ICD-10-CM | POA: Insufficient documentation

## 2015-10-09 DIAGNOSIS — Z8701 Personal history of pneumonia (recurrent): Secondary | ICD-10-CM | POA: Insufficient documentation

## 2015-10-09 DIAGNOSIS — F419 Anxiety disorder, unspecified: Secondary | ICD-10-CM | POA: Insufficient documentation

## 2015-10-09 DIAGNOSIS — Z6837 Body mass index (BMI) 37.0-37.9, adult: Secondary | ICD-10-CM | POA: Diagnosis not present

## 2015-10-09 DIAGNOSIS — N72 Inflammatory disease of cervix uteri: Secondary | ICD-10-CM | POA: Insufficient documentation

## 2015-10-09 DIAGNOSIS — Z79899 Other long term (current) drug therapy: Secondary | ICD-10-CM | POA: Diagnosis not present

## 2015-10-09 DIAGNOSIS — E8809 Other disorders of plasma-protein metabolism, not elsewhere classified: Secondary | ICD-10-CM | POA: Insufficient documentation

## 2015-10-09 DIAGNOSIS — N939 Abnormal uterine and vaginal bleeding, unspecified: Principal | ICD-10-CM | POA: Diagnosis present

## 2015-10-09 DIAGNOSIS — E669 Obesity, unspecified: Secondary | ICD-10-CM | POA: Diagnosis not present

## 2015-10-09 DIAGNOSIS — Z888 Allergy status to other drugs, medicaments and biological substances status: Secondary | ICD-10-CM | POA: Diagnosis not present

## 2015-10-09 DIAGNOSIS — Z9049 Acquired absence of other specified parts of digestive tract: Secondary | ICD-10-CM | POA: Diagnosis not present

## 2015-10-09 DIAGNOSIS — Z803 Family history of malignant neoplasm of breast: Secondary | ICD-10-CM | POA: Diagnosis not present

## 2015-10-09 DIAGNOSIS — N838 Other noninflammatory disorders of ovary, fallopian tube and broad ligament: Secondary | ICD-10-CM | POA: Diagnosis not present

## 2015-10-09 HISTORY — PX: LAPAROSCOPIC BILATERAL SALPINGECTOMY: SHX5889

## 2015-10-09 HISTORY — PX: CYSTOSCOPY: SHX5120

## 2015-10-09 HISTORY — PX: LAPAROSCOPIC HYSTERECTOMY: SHX1926

## 2015-10-09 LAB — POCT PREGNANCY, URINE: PREG TEST UR: NEGATIVE

## 2015-10-09 SURGERY — HYSTERECTOMY, TOTAL, LAPAROSCOPIC
Anesthesia: General

## 2015-10-09 MED ORDER — LACTATED RINGERS IV SOLN
INTRAVENOUS | Status: DC
  Administered 2015-10-09: 17:00:00 via INTRAVENOUS

## 2015-10-09 MED ORDER — FENTANYL CITRATE (PF) 100 MCG/2ML IJ SOLN
INTRAMUSCULAR | Status: DC | PRN
  Administered 2015-10-09 (×7): 50 ug via INTRAVENOUS

## 2015-10-09 MED ORDER — LACTATED RINGERS IV SOLN
INTRAVENOUS | Status: DC
  Administered 2015-10-09 (×2): via INTRAVENOUS

## 2015-10-09 MED ORDER — MIDAZOLAM HCL 2 MG/2ML IJ SOLN
INTRAMUSCULAR | Status: DC | PRN
  Administered 2015-10-09: 2 mg via INTRAVENOUS

## 2015-10-09 MED ORDER — HYDROMORPHONE HCL 1 MG/ML IJ SOLN
0.2000 mg | INTRAMUSCULAR | Status: DC | PRN
  Administered 2015-10-09: 0.4 mg via INTRAVENOUS
  Filled 2015-10-09: qty 1

## 2015-10-09 MED ORDER — DEXTROSE 5 % IV SOLN
3.0000 g | INTRAVENOUS | Status: AC
  Administered 2015-10-09: 3 g via INTRAVENOUS
  Filled 2015-10-09: qty 3000

## 2015-10-09 MED ORDER — ONDANSETRON HCL 4 MG/2ML IJ SOLN
INTRAMUSCULAR | Status: DC | PRN
  Administered 2015-10-09: 4 mg via INTRAVENOUS

## 2015-10-09 MED ORDER — SUGAMMADEX SODIUM 500 MG/5ML IV SOLN
INTRAVENOUS | Status: DC | PRN
  Administered 2015-10-09: 220 mg via INTRAVENOUS

## 2015-10-09 MED ORDER — PROPOFOL 10 MG/ML IV BOLUS
INTRAVENOUS | Status: DC | PRN
  Administered 2015-10-09: 200 mg via INTRAVENOUS
  Administered 2015-10-09: 40 mg via INTRAVENOUS

## 2015-10-09 MED ORDER — ROCURONIUM BROMIDE 100 MG/10ML IV SOLN
INTRAVENOUS | Status: DC | PRN
  Administered 2015-10-09: 10 mg via INTRAVENOUS
  Administered 2015-10-09: 40 mg via INTRAVENOUS
  Administered 2015-10-09 (×2): 10 mg via INTRAVENOUS

## 2015-10-09 MED ORDER — LIDOCAINE HCL (PF) 1 % IJ SOLN
INTRAMUSCULAR | Status: AC
  Filled 2015-10-09: qty 2

## 2015-10-09 MED ORDER — ONDANSETRON HCL 4 MG PO TABS: 4.0000 mg | ORAL_TABLET | Freq: Four times a day (QID) | ORAL | 0 refills | Status: DC | PRN

## 2015-10-09 MED ORDER — 0.9 % SODIUM CHLORIDE (POUR BTL) OPTIME
TOPICAL | Status: DC | PRN
  Administered 2015-10-09: 1000 mL

## 2015-10-09 MED ORDER — OXYCODONE-ACETAMINOPHEN 5-325 MG PO TABS: 1.0000 | ORAL_TABLET | ORAL | 0 refills | Status: DC | PRN

## 2015-10-09 MED ORDER — LIDOCAINE HCL (CARDIAC) 20 MG/ML IV SOLN
INTRAVENOUS | Status: DC | PRN
  Administered 2015-10-09: 50 mg via INTRAVENOUS

## 2015-10-09 MED ORDER — MENTHOL 3 MG MT LOZG
1.0000 | LOZENGE | OROMUCOSAL | Status: DC | PRN
  Filled 2015-10-09: qty 9

## 2015-10-09 MED ORDER — FENTANYL CITRATE (PF) 100 MCG/2ML IJ SOLN
INTRAMUSCULAR | Status: AC
  Administered 2015-10-09: 25 ug via INTRAVENOUS
  Filled 2015-10-09: qty 2

## 2015-10-09 MED ORDER — DEXAMETHASONE SODIUM PHOSPHATE 10 MG/ML IJ SOLN
INTRAMUSCULAR | Status: DC | PRN
  Administered 2015-10-09: 5 mg via INTRAVENOUS

## 2015-10-09 MED ORDER — ONDANSETRON HCL 4 MG/2ML IJ SOLN: 4.0000 mg | Freq: Once | INTRAMUSCULAR | Status: DC | PRN

## 2015-10-09 MED ORDER — DOCUSATE SODIUM 100 MG PO CAPS
100.0000 mg | ORAL_CAPSULE | Freq: Two times a day (BID) | ORAL | Status: DC
Start: 2015-10-09 — End: 2015-10-09

## 2015-10-09 MED ORDER — PHENYLEPHRINE HCL 10 MG/ML IJ SOLN
INTRAMUSCULAR | Status: DC | PRN
  Administered 2015-10-09 (×5): 100 ug via INTRAVENOUS

## 2015-10-09 MED ORDER — ACETAMINOPHEN 10 MG/ML IV SOLN
INTRAVENOUS | Status: DC | PRN
  Administered 2015-10-09: 1000 mg via INTRAVENOUS

## 2015-10-09 MED ORDER — BUPIVACAINE HCL (PF) 0.5 % IJ SOLN
INTRAMUSCULAR | Status: AC
  Filled 2015-10-09: qty 30

## 2015-10-09 MED ORDER — FENTANYL CITRATE (PF) 100 MCG/2ML IJ SOLN
25.0000 ug | INTRAMUSCULAR | Status: DC | PRN
Start: 2015-10-09 — End: 2015-10-09
  Administered 2015-10-09 (×4): 25 ug via INTRAVENOUS

## 2015-10-09 MED ORDER — ACETAMINOPHEN 10 MG/ML IV SOLN
INTRAVENOUS | Status: AC
  Filled 2015-10-09: qty 100

## 2015-10-09 MED ORDER — IBUPROFEN 600 MG PO TABS: 600.0000 mg | ORAL_TABLET | Freq: Four times a day (QID) | ORAL | 0 refills | Status: DC | PRN

## 2015-10-09 MED ORDER — DOCUSATE SODIUM 100 MG PO CAPS: 100.0000 mg | ORAL_CAPSULE | Freq: Two times a day (BID) | ORAL | 0 refills | Status: DC

## 2015-10-09 MED ORDER — BUPIVACAINE HCL 0.5 % IJ SOLN
INTRAMUSCULAR | Status: DC | PRN
  Administered 2015-10-09: 17 mL

## 2015-10-09 MED ORDER — MIDAZOLAM HCL 2 MG/2ML IJ SOLN
INTRAMUSCULAR | Status: AC
  Filled 2015-10-09: qty 2

## 2015-10-09 MED ORDER — KETOROLAC TROMETHAMINE 30 MG/ML IJ SOLN
INTRAMUSCULAR | Status: DC | PRN
  Administered 2015-10-09: 30 mg via INTRAVENOUS

## 2015-10-09 MED ORDER — IBUPROFEN 600 MG PO TABS
600.0000 mg | ORAL_TABLET | Freq: Four times a day (QID) | ORAL | Status: DC | PRN
  Administered 2015-10-09: 600 mg via ORAL
  Filled 2015-10-09: qty 1

## 2015-10-09 MED ORDER — MIDAZOLAM HCL 2 MG/2ML IJ SOLN
2.0000 mg | Freq: Once | INTRAMUSCULAR | Status: AC
  Administered 2015-10-09: 2 mg via INTRAVENOUS

## 2015-10-09 MED ORDER — OXYCODONE-ACETAMINOPHEN 5-325 MG PO TABS
1.0000 | ORAL_TABLET | ORAL | Status: DC | PRN
  Administered 2015-10-09: 1 via ORAL
  Filled 2015-10-09: qty 1

## 2015-10-09 MED ORDER — METHYLENE BLUE 0.5 % INJ SOLN
INTRAVENOUS | Status: AC
  Filled 2015-10-09: qty 10

## 2015-10-09 MED ORDER — ONDANSETRON HCL 4 MG/2ML IJ SOLN: 4.0000 mg | Freq: Four times a day (QID) | INTRAMUSCULAR | Status: DC | PRN

## 2015-10-09 MED ORDER — ONDANSETRON HCL 4 MG PO TABS: 4.0000 mg | ORAL_TABLET | Freq: Four times a day (QID) | ORAL | Status: DC | PRN

## 2015-10-09 SURGICAL SUPPLY — 64 items
BAG URO DRAIN 2000ML W/SPOUT (MISCELLANEOUS) ×4 IMPLANT
BLADE SURG SZ11 CARB STEEL (BLADE) ×4 IMPLANT
CATH FOLEY 2WAY  5CC 16FR (CATHETERS) ×2
CATH ROBINSON RED A/P 16FR (CATHETERS) ×4 IMPLANT
CATH URTH 16FR FL 2W BLN LF (CATHETERS) ×2 IMPLANT
CHLORAPREP W/TINT 26ML (MISCELLANEOUS) ×4 IMPLANT
CLOSURE WOUND 1/4X4 (GAUZE/BANDAGES/DRESSINGS) ×1
DEFOGGER SCOPE WARMER CLEARIFY (MISCELLANEOUS) ×4 IMPLANT
DEVICE SUTURE ENDOST 10MM (ENDOMECHANICALS) IMPLANT
DRAPE STERI POUCH LG 24X46 STR (DRAPES) ×4 IMPLANT
DRSG TEGADERM 2-3/8X2-3/4 SM (GAUZE/BANDAGES/DRESSINGS) ×12 IMPLANT
ENDOPOUCH RETRIEVER 10 (MISCELLANEOUS) ×4 IMPLANT
ENDOSTITCH 0 SINGLE 48 (SUTURE) IMPLANT
GAUZE SPONGE NON-WVN 2X2 STRL (MISCELLANEOUS) ×4 IMPLANT
GLOVE BIO SURGEON STRL SZ 6.5 (GLOVE) ×21 IMPLANT
GLOVE BIO SURGEON STRL SZ7 (GLOVE) ×12 IMPLANT
GLOVE BIO SURGEONS STRL SZ 6.5 (GLOVE) ×7
GLOVE BIOGEL PI IND STRL 7.0 (GLOVE) ×2 IMPLANT
GLOVE BIOGEL PI INDICATOR 7.0 (GLOVE) ×2
GLOVE INDICATOR 7.0 STRL GRN (GLOVE) ×16 IMPLANT
GOWN STRL REUS W/ TWL LRG LVL3 (GOWN DISPOSABLE) ×4 IMPLANT
GOWN STRL REUS W/ TWL XL LVL3 (GOWN DISPOSABLE) ×2 IMPLANT
GOWN STRL REUS W/TWL LRG LVL3 (GOWN DISPOSABLE) ×4
GOWN STRL REUS W/TWL XL LVL3 (GOWN DISPOSABLE) ×2
IRRIGATION STRYKERFLOW (MISCELLANEOUS) ×2 IMPLANT
IRRIGATOR STRYKERFLOW (MISCELLANEOUS) ×4
IV LACTATED RINGERS 1000ML (IV SOLUTION) ×4 IMPLANT
IV NS 1000ML (IV SOLUTION) ×2
IV NS 1000ML BAXH (IV SOLUTION) ×2 IMPLANT
KIT RM TURNOVER CYSTO AR (KITS) ×4 IMPLANT
LABEL OR SOLS (LABEL) ×4 IMPLANT
LIGASURE 5MM LAPAROSCOPIC (INSTRUMENTS) ×4 IMPLANT
LIQUID BAND (GAUZE/BANDAGES/DRESSINGS) ×4 IMPLANT
MANIPULATOR VCARE LG CRV RETR (MISCELLANEOUS) IMPLANT
MANIPULATOR VCARE SML CRV RETR (MISCELLANEOUS) ×4 IMPLANT
MANIPULATOR VCARE STD CRV RETR (MISCELLANEOUS) IMPLANT
NEEDLE VERESS 14GA 120MM (NEEDLE) ×4 IMPLANT
NS IRRIG 500ML POUR BTL (IV SOLUTION) ×4 IMPLANT
OCCLUDER COLPOPNEUMO (BALLOONS) ×4 IMPLANT
PACK GYN LAPAROSCOPIC (MISCELLANEOUS) ×4 IMPLANT
PAD OB MATERNITY 4.3X12.25 (PERSONAL CARE ITEMS) ×4 IMPLANT
PAD PREP 24X41 OB/GYN DISP (PERSONAL CARE ITEMS) ×4 IMPLANT
SCISSORS METZENBAUM CVD 33 (INSTRUMENTS) ×4 IMPLANT
SET CYSTO W/LG BORE CLAMP LF (SET/KITS/TRAYS/PACK) ×4 IMPLANT
SHEARS HARMONIC ACE PLUS 36CM (ENDOMECHANICALS) ×4 IMPLANT
SLEEVE ENDOPATH XCEL 5M (ENDOMECHANICALS) ×4 IMPLANT
SPONGE LAP 18X18 5 PK (GAUZE/BANDAGES/DRESSINGS) ×4 IMPLANT
SPONGE VERSALON 2X2 STRL (MISCELLANEOUS) ×4
STRIP CLOSURE SKIN 1/4X4 (GAUZE/BANDAGES/DRESSINGS) ×3 IMPLANT
SURGILUBE 2OZ TUBE FLIPTOP (MISCELLANEOUS) ×4 IMPLANT
SUT MNCRL AB 4-0 PS2 18 (SUTURE) ×4 IMPLANT
SUT VIC AB 0 CT1 36 (SUTURE) ×8 IMPLANT
SUT VIC AB 2-0 UR6 27 (SUTURE) ×4 IMPLANT
SUT VIC AB 4-0 PS2 18 (SUTURE) ×8 IMPLANT
SUT VIC AB 4-0 SH 27 (SUTURE) ×2
SUT VIC AB 4-0 SH 27XANBCTRL (SUTURE) ×2 IMPLANT
SWABSTK COMLB BENZOIN TINCTURE (MISCELLANEOUS) ×4 IMPLANT
SYR 50ML LL SCALE MARK (SYRINGE) ×4 IMPLANT
SYRINGE 10CC LL (SYRINGE) ×4 IMPLANT
TROCAR ENDO BLADELESS 11MM (ENDOMECHANICALS) ×4 IMPLANT
TROCAR XCEL NON-BLD 5MMX100MML (ENDOMECHANICALS) ×4 IMPLANT
TROCAR XCEL UNIV SLVE 11M 100M (ENDOMECHANICALS) ×4 IMPLANT
TUBING INSUFFLATOR HEATED (MISCELLANEOUS) ×4 IMPLANT
TUBING INSUFFLATOR HI FLOW (MISCELLANEOUS) ×4 IMPLANT

## 2015-10-09 NOTE — Progress Notes (Signed)
Patient discharged home with significant other. Discharge instructions, prescriptions and follow up appointment given to and reviewed with patient and significant other. Patient verbalized understanding. Escorted out via wheelchair by Guido SanderMinerva Jeffreys, NT.

## 2015-10-09 NOTE — Op Note (Signed)
Elaine [name] PROCEDURE DATE: 10/09/2015  PREOPERATIVE DIAGNOSIS: Abnormal uterine bleeding POSTOPERATIVE DIAGNOSIS: The same PROCEDURE: Total laparoscopic hysterectomy with vaginal closure, bilateral salpingectomy, cystourethroscopy SURGEON:  Dr. Christeen DouglasBethany Kalyn Dimattia ASSISTANT: Dr. Maisie Fushomas Schermerhorn Anesthesiologist: Yevette EdwardsJames G Adams, MD Anesthesiologist: Yevette EdwardsJames G Adams, MD CRNA: Mathews ArgyleBenjamin Logan, CRNA; Michaele OfferKasey Savage, CRNA; Ginger CarneStephanie Michelet, CRNA; Casey Burkitthuy Hoang, CRNA  INDICATIONS: 35 y.o.  G0  here for definitive surgical management secondary to the indications listed under preoperative diagnoses; please see preoperative note for further details.  Risks of surgery were discussed with the patient including but not limited to: bleeding which may require transfusion or reoperation; infection which may require antibiotics; injury to bowel, bladder, ureters or other surrounding organs; need for additional procedures; thromboembolic phenomenon, incisional problems and other postoperative/anesthesia complications. Written informed consent was obtained.    FINDINGS:  Normally-sized uterus, normal bilateral tubes and ovaries. Narrow pubic arch.  ANESTHESIA:    General INTRAVENOUS FLUIDS: 1000  ml ESTIMATED BLOOD LOSS: 20 ml URINE OUTPUT: 200 ml   SPECIMENS: Uterus, cervix, bilateral fallopian tubes COMPLICATIONS: None immediate  PROCEDURE IN DETAIL:  The patient received prophalactic intravenous antibiotics and had sequential compression devices applied to her lower extremities while in the preoperative area.  She was then taken to the operating room where general anesthesia was administered and was found to be adequate.  She was placed in the dorsal lithotomy position, and was prepped and draped in a sterile manner.  A formal time out was performed with all team members present and in agreement.  A V-care uterine manipulator was placed at this time.  A Foley catheter was inserted into her bladder and  attached to constant drainage. Attention was turned to the abdomen and 0.5% Marcaine infused subq. A 5mm umbilical incision was made with the scalpel.  The Optiview 5-mm trocar and sleeve were then advanced without difficulty with the laparoscope under direct visualization into the abdomen.  The abdomen was then insufflated with carbon dioxide gas and adequate pneumoperitoneum was obtained.  A survey of the patient's pelvis and abdomen revealed the findings above.  Bilateral lower quadrant ports (5 mm on the right and 5 mm on the left) were then placed under direct visualization.  The pelvis was then carefully examined.  Attention was turned to the fallopian tubes; these were freed from the underlying mesosalpinx and the uterine attachments using the Ligasure device.  The bilateral round and broad ligaments were then clamped and transected with the Ligasure device.  The uterine artery was then skeletonized and a bladder flap was created.  The ureters were noted to be safely away from the area of dissection.  The bladder was then bluntly dissected off the lower uterine segment.    At this point, attention was turned to the uterine vessels, which were clamped and cauterized using the Ligasure on the left, and then the right. After the uterine blood flow at the level of the internal os was controlled, both arteries were cut with the Ligasure.  Good hemostasis was noted overall.  The uterosacral and cardinal ligaments were clamped, cut and ligated bilaterally .  Attention was then turned to the cervicovaginal junction, and I attempted to use the monopolar scissors for the colpotomy; however, the monopolar scissors were not working and we were unable to get them to work. The decision was made to use a Harmonic to transect the cervix from the surrounding vagina using the ring of the V-care as a guide. This was done circumferentially allowing total hysterectomy.  The uterus was then removed from the vagina and the  vaginal cuff incision was then closed with running 0 Vicryl from below.  This was difficult because of her narrow pubic arch, but it was accomplished effectively. Overall excellent hemostasis was noted.    Attention was returned to the abdomen.The ureters were reexamined bilaterally and were pulsating normally. The abdominal pressure was reduced and hemostasis was confirmed.    Attention was turned to the bladder from below, and cystoscopy showed bilateral ureteral jets.  No stitches were visualized in the bladder during cystoscopy.  All trocars were removed under direct visualization, and the abdomen was desufflated.  All skin incisions were closed with 4-0 Vicryl subcuticular stitches and Dermabond. The patient tolerated the procedures well.  All instruments, needles, and sponge counts were correct x 2. The patient was taken to the recovery room awake, extubated and in stable condition.   She received IV Toradol and IV acetaminophen prior to leaving the OR. The patient is interested in going home if possible. We will monitor her for 4 hours postoperatively, and if she meets discharge guidelines, we'll plan to send her home tonight. She will follow up in 2 weeks in the office for postop visit.

## 2015-10-09 NOTE — Transfer of Care (Signed)
Immediate Anesthesia Transfer of Care Note  Patient: Hampton AbbotCynthia A Ganim  Procedure(s) Performed: Procedure(s): HYSTERECTOMY TOTAL LAPAROSCOPIC (N/A) LAPAROSCOPIC BILATERAL SALPINGECTOMY (Bilateral) CYSTOSCOPY (N/A)  Patient Location: PACU  Anesthesia Type:General  Level of Consciousness: awake and responds to stimulation  Airway & Oxygen Therapy: Patient Spontanous Breathing and Patient connected to face mask oxygen  Post-op Assessment: Report given to RN and Post -op Vital signs reviewed and stable  Post vital signs: Reviewed and stable  Last Vitals:  Vitals:   10/09/15 1523 10/09/15 1529  BP: (!) 153/78 (!) 153/78  Pulse: (!) 107 (!) 102  Resp: 15 12  Temp: 37.5 C     Last Pain:  Vitals:   10/09/15 1039  TempSrc: Oral         Complications: No apparent anesthesia complications

## 2015-10-09 NOTE — Anesthesia Preprocedure Evaluation (Signed)
Anesthesia Evaluation  Patient identified by MRN, date of birth, ID band Patient awake    Reviewed: Allergy & Precautions, H&P , NPO status , Patient's Chart, lab work & pertinent test results, reviewed documented beta blocker date and time   History of Anesthesia Complications (+) PSEUDOCHOLINESTERASE DEFICIENCY and history of anesthetic complications  Airway Mallampati: III  TM Distance: >3 FB Neck ROM: full    Dental  (+) Teeth Intact   Pulmonary neg pulmonary ROS, Current Smoker,    Pulmonary exam normal        Cardiovascular negative cardio ROS Normal cardiovascular exam Rhythm:regular Rate:Normal     Neuro/Psych  Neuromuscular disease negative neurological ROS  negative psych ROS   GI/Hepatic negative GI ROS, Neg liver ROS,   Endo/Other  negative endocrine ROS  Renal/GU negative Renal ROS  negative genitourinary   Musculoskeletal   Abdominal   Peds  Hematology negative hematology ROS (+)   Anesthesia Other Findings Past Medical History: No date: Anxiety     Comment: panic attack 12/2014 No date: Complication of anesthesia No date: PONV (postoperative nausea and vomiting) 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 Past Surgical History: No date: APPENDECTOMY 2014: BACK SURGERY No date: CHOLECYSTECTOMY 1987: FRACTURE SURGERY Right     Comment: leg BMI    Body Mass Index:  37.43 kg/m     Reproductive/Obstetrics negative OB ROS                             Anesthesia Physical Anesthesia Plan  ASA: II  Anesthesia Plan: General ETT   Post-op Pain Management:    Induction:   Airway Management Planned:   Additional Equipment:   Intra-op Plan:   Post-operative Plan:   Informed Consent: I have reviewed the patients History and Physical, chart, labs and discussed the procedure including the risks, benefits and alternatives for the proposed anesthesia with the patient or  authorized representative who has indicated his/her understanding and acceptance.   Dental Advisory Given  Plan Discussed with: CRNA  Anesthesia Plan Comments:         Anesthesia Quick Evaluation

## 2015-10-09 NOTE — Discharge Summary (Signed)
  POD#0 - TLH, BS, cystourethroscopy  S: Doing well.  Day of Surgery Subjective: The patient is doing well.  No nausea or vomiting. Pain is adequately controlled. Tolerating regular diet. Walking without assistance.  Objective: Vital signs in last 24 hours: Temp:  [98 F (36.7 C)-99.5 F (37.5 C)] 98.1 F (36.7 C) (08/18 1637) Pulse Rate:  [80-107] 80 (08/18 1637) Resp:  [12-20] 16 (08/18 1637) BP: (135-157)/(72-85) 157/82 (08/18 1637) SpO2:  [95 %-99 %] 96 % (08/18 1637) Weight:  [239 lb (108.4 kg)] 239 lb (108.4 kg) (08/18 1039)  Intake/Output  Intake/Output Summary (Last 24 hours) at 10/09/15 1718 Last data filed at 10/09/15 1637  Gross per 24 hour  Intake             1300 ml  Output              245 ml  Net             1055 ml    Physical Exam:  General: Alert and oriented. CV: RRR Lungs: Clear bilaterally. GI: Soft, Nondistended. Incisions: Clean and dry. Urine: Clear, spontaneous void Extremities: Nontender, no erythema, no edema.  Lab Results: No results for input(s): HGB, HCT, WBC, PLT in the last 72 hours.               Results for orders placed or performed during the hospital encounter of 10/09/15 (from the past 24 hour(s))  Pregnancy, urine POC     Status: None   Collection Time: 10/09/15 10:57 AM  Result Value Ref Range   Preg Test, Ur NEGATIVE NEGATIVE    Assessment/Plan: POD# 0 s/p TLH, BS, cystourethroscopy.  1) Ambulate, Incentive spirometry 2) Advance diet as tolerated 3) Discharge home today anticipated    Elaine DouglasBethany Javohn Basey, MD   LOS: 0 days   Elaine DouglasBEASLEY, Elaine [name] 10/09/2015, 5:18 PM

## 2015-10-09 NOTE — Anesthesia Procedure Notes (Signed)
Procedure Name: Intubation Performed by: Mathews ArgyleLOGAN, Jamesia Linnen Pre-anesthesia Checklist: Patient identified, Patient being monitored, Timeout performed, Emergency Drugs available and Suction available Patient Re-evaluated:Patient Re-evaluated prior to inductionOxygen Delivery Method: Circle system utilized Preoxygenation: Pre-oxygenation with 100% oxygen Intubation Type: IV induction Ventilation: Mask ventilation without difficulty and Oral airway inserted - appropriate to patient size Laryngoscope Size: 3 and McGraph Grade View: Grade I Tube type: Oral Tube size: 7.0 mm Number of attempts: 1 Airway Equipment and Method: Stylet and Video-laryngoscopy Placement Confirmation: ETT inserted through vocal cords under direct vision,  positive ETCO2 and breath sounds checked- equal and bilateral Secured at: 20 cm Tube secured with: Tape Dental Injury: Teeth and Oropharynx as per pre-operative assessment

## 2015-10-12 ENCOUNTER — Encounter: Payer: Self-pay | Admitting: Obstetrics and Gynecology

## 2015-10-12 NOTE — Anesthesia Postprocedure Evaluation (Signed)
Anesthesia Post Note  Patient: Elaine AbbotCynthia A Cowger  Procedure(s) Performed: Procedure(s) (LRB): HYSTERECTOMY TOTAL LAPAROSCOPIC (N/A) LAPAROSCOPIC BILATERAL SALPINGECTOMY (Bilateral) CYSTOSCOPY (N/A)  Patient location during evaluation: PACU Anesthesia Type: General Level of consciousness: awake and alert Pain management: pain level controlled Vital Signs Assessment: post-procedure vital signs reviewed and stable Respiratory status: spontaneous breathing, nonlabored ventilation, respiratory function stable and patient connected to nasal cannula oxygen Cardiovascular status: blood pressure returned to baseline and stable Postop Assessment: no signs of nausea or vomiting Anesthetic complications: no    Last Vitals:  Vitals:   10/09/15 1637 10/09/15 1731  BP: (!) 157/82 (!) 146/88  Pulse: 80 83  Resp: 16 18  Temp: 36.7 C 36.7 C    Last Pain:  Vitals:   10/09/15 1758  TempSrc:   PainSc: 5                  Yevette EdwardsJames G Adams

## 2015-10-13 LAB — SURGICAL PATHOLOGY

## 2015-10-22 ENCOUNTER — Other Ambulatory Visit: Payer: Self-pay | Admitting: Obstetrics and Gynecology

## 2015-10-22 DIAGNOSIS — G8918 Other acute postprocedural pain: Secondary | ICD-10-CM

## 2015-11-03 ENCOUNTER — Ambulatory Visit
Admission: RE | Admit: 2015-11-03 | Discharge: 2015-11-03 | Disposition: A | Discharge status: Home / Self Care | Payer: BLUE CROSS/BLUE SHIELD | Source: Ambulatory Visit | Attending: Obstetrics and Gynecology | Admitting: Obstetrics and Gynecology

## 2015-11-03 DIAGNOSIS — Z90710 Acquired absence of both cervix and uterus: Secondary | ICD-10-CM | POA: Diagnosis not present

## 2015-11-03 DIAGNOSIS — G8918 Other acute postprocedural pain: Secondary | ICD-10-CM | POA: Diagnosis not present

## 2015-11-03 MED ORDER — IOPAMIDOL (ISOVUE-300) INJECTION 61%
100.0000 mL | Freq: Once | INTRAVENOUS | Status: AC | PRN
  Administered 2015-11-03: 100 mL via INTRAVENOUS

## 2016-03-16 ENCOUNTER — Ambulatory Visit
Admission: EM | Admit: 2016-03-16 | Discharge: 2016-03-16 | Disposition: A | Discharge status: Home / Self Care | Payer: BLUE CROSS/BLUE SHIELD | Attending: Family Medicine | Admitting: Family Medicine

## 2016-03-16 DIAGNOSIS — J111 Influenza due to unidentified influenza virus with other respiratory manifestations: Secondary | ICD-10-CM

## 2016-03-16 DIAGNOSIS — R69 Illness, unspecified: Secondary | ICD-10-CM | POA: Diagnosis not present

## 2016-03-16 MED ORDER — HYDROCOD POLST-CPM POLST ER 10-8 MG/5ML PO SUER: 5.0000 mL | Freq: Two times a day (BID) | ORAL | 0 refills | Status: DC | PRN

## 2016-03-16 MED ORDER — OSELTAMIVIR PHOSPHATE 75 MG PO CAPS: 75.0000 mg | ORAL_CAPSULE | Freq: Two times a day (BID) | ORAL | 0 refills | Status: DC

## 2016-03-16 NOTE — ED Triage Notes (Signed)
Patient complains of cough, congestion, sore throat, chills. 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 Patient states that symptoms started earlier today.

## 2016-03-16 NOTE — ED Provider Notes (Signed)
[facility]    CSN: [id number] Arrival date & time: 03/16/16  1716     History   Chief Complaint Chief Complaint  Patient presents with  . Cough    HPI Elaine [name] is a 36 y.o. female.   The history is provided by the patient.  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 Anxiety    panic attack 12/2014  . Complication of anesthesia   . PONV (postoperative nausea and vomiting)   . Pseudocholinesterase deficiency     Patient Active Problem List   Diagnosis Date Noted  . Abnormal uterine bleeding 10/09/2015    Past Surgical History:  Procedure Laterality Date  . APPENDECTOMY    . BACK SURGERY  2014  . CHOLECYSTECTOMY    . CYSTOSCOPY N/A 10/09/2015   Procedure: CYSTOSCOPY;  Surgeon: Christeen DouglasBethany Beasley, MD;  Location: [facility];  Service: Gynecology;  Laterality: N/A;  . FRACTURE SURGERY Right 1987   leg  . LAPAROSCOPIC BILATERAL SALPINGECTOMY Bilateral 10/09/2015   Procedure: LAPAROSCOPIC BILATERAL SALPINGECTOMY;  Surgeon: Christeen DouglasBethany Beasley, MD;  Location: [facility];  Service: Gynecology;  Laterality: Bilateral;  . LAPAROSCOPIC HYSTERECTOMY N/A 10/09/2015   Procedure: HYSTERECTOMY TOTAL LAPAROSCOPIC;  Surgeon: Christeen DouglasBethany Beasley, MD;  Location: [facility];  Service: Gynecology;  Laterality: N/A;    OB History    No data available       Home Medications    Prior to Admission medications   Medication Sig Start Date End Date Taking?  Authorizing Provider  chlorpheniramine-HYDROcodone (TUSSIONEX PENNKINETIC ER) 10-8 MG/5ML SUER Take 5 mLs by mouth every 12 (twelve) hours as needed. 03/16/16   Payton Mccallumrlando Kaicen Desena, MD  docusate sodium (COLACE) 100 MG capsule Take 1 capsule (100 mg total) by mouth 2 (two) times daily. 10/09/15   Christeen DouglasBethany Beasley, MD  ibuprofen (ADVIL,MOTRIN) 600 MG tablet Take 1 tablet (600 mg total) by mouth every 6 (six) hours as needed (mild pain). 10/09/15   Christeen DouglasBethany Beasley, MD  ondansetron (ZOFRAN) 4 MG tablet Take 1 tablet (4 mg total) by mouth every 6 (six) hours as needed for nausea. 10/09/15   Christeen DouglasBethany Beasley, MD  oseltamivir (TAMIFLU) 75 MG capsule Take 1 capsule (75 mg total) by mouth 2 (two) times daily. 03/16/16   Payton Mccallumrlando Ariatna Jester, MD  oxyCODONE-acetaminophen (PERCOCET/ROXICET) 5-325 MG tablet Take 1-2 tablets by mouth every 4 (four) hours as needed (moderate to severe pain (when tolerating fluids)). 10/09/15   Christeen DouglasBethany Beasley, MD    Family History History reviewed. No pertinent family history.  Social History Social History  Substance Use Topics  . Smoking status: Current Every Day Smoker    Packs/day: 1.50    Types: Cigarettes  . Smokeless tobacco: Never Used  . Alcohol use 1.8 oz/week    3 Glasses of wine per week     Comment: socially     Allergies   Succinylcholine   Review of Systems Review of Systems  Constitutional: Positive for fatigue and fever.  HENT: Positive for rhinorrhea. Negative for sinus pain.   Respiratory: Positive for cough. Negative for wheezing.   Musculoskeletal: Positive for myalgias.     Physical Exam Triage Vital Signs ED Triage Vitals  Enc Vitals Group     BP 03/16/16 1819 (!) 141/66     Pulse Rate 03/16/16 1819 (!) 106     Resp 03/16/16 1819 16     Temp 03/16/16 1819 99.3 F (37.4 C)     Temp Source 03/16/16 1819 Oral     SpO2 03/16/16 1819 100 %     Weight 03/16/16 1820 233 lb (105.7 kg)     Height 03/16/16 1820 5\' 7"  (1.702 m)     Head Circumference --        Peak Flow --      Pain Score 03/16/16 1822 7     Pain Loc --      Pain Edu? --      Excl. in GC? --    No data found.   Updated Vital Signs BP (!) 141/66 (BP Location: Left Arm)   Pulse (!) 106   Temp 99.3 F (37.4 C) (Oral)   Resp 16   Ht 5\' 7"  (1.702 m)   Wt 233 lb (105.7 kg)   LMP 10/07/2015 (Exact Date)   SpO2 100%   BMI 36.49 kg/m   Visual Acuity Right Eye Distance:   Left Eye Distance:   Bilateral Distance:    Right Eye Near:   Left Eye Near:    Bilateral Near:     Physical Exam  Constitutional: She appears well-developed and well-nourished. No distress.  HENT:  Head: Normocephalic and atraumatic.  Right Ear: Tympanic membrane, external ear and ear canal normal.  Left Ear: Tympanic membrane, external ear and ear canal normal.  Nose: Mucosal edema and rhinorrhea present. No nose lacerations, sinus tenderness, nasal deformity, septal deviation or nasal septal hematoma. No epistaxis.  No foreign bodies. Right sinus exhibits no maxillary sinus tenderness and no frontal sinus tenderness. Left sinus exhibits no maxillary sinus tenderness and no frontal sinus tenderness.  Mouth/Throat: Uvula is midline, oropharynx is clear and moist and mucous membranes are normal. No oropharyngeal exudate.  Eyes: Conjunctivae and EOM are normal. Pupils are equal, round, and reactive to light. Right eye exhibits no discharge. Left eye exhibits no discharge. No scleral icterus.  Neck: Normal range of motion. Neck supple. No thyromegaly present.  Cardiovascular: Normal rate, regular rhythm and normal heart sounds.   Pulmonary/Chest: Effort normal and breath sounds normal. No respiratory distress. She has no wheezes. She has no rales.  Lymphadenopathy:    She has no cervical adenopathy.  Skin: She is not diaphoretic.  Nursing note and vitals reviewed.    UC Treatments / Results  Labs (all labs ordered are listed, but only abnormal results are displayed) Labs Reviewed - No data to  display  EKG  EKG Interpretation None       Radiology No results found.  Procedures Procedures (including critical care time)  Medications Ordered in UC Medications - No data to display   Initial Impression / Assessment and Plan / UC Course  I have reviewed the triage vital signs and the nursing notes.  Pertinent labs & imaging results that were available during my care of the patient were reviewed by me and considered in my medical decision making (see chart for details).       Final Clinical Impressions(s) / UC Diagnoses   Final diagnoses:  Influenza-like  illness    New Prescriptions Discharge Medication List as of 03/16/2016  7:15 PM    START taking these medications   Details  chlorpheniramine-HYDROcodone (TUSSIONEX PENNKINETIC ER) 10-8 MG/5ML SUER Take 5 mLs by mouth every 12 (twelve) hours as needed., Starting Wed 03/16/2016, Normal    oseltamivir (TAMIFLU) 75 MG capsule Take 1 capsule (75 mg total) by mouth 2 (two) times daily., Starting Wed 03/16/2016, Normal       1. diagnosis reviewed with patient 2. rx as per orders above; reviewed possible side effects, interactions, risks and benefits  3. Recommend supportive treatment with rest, fluids 4. Follow-up prn if symptoms worsen or don't improve   Payton Mccallum, MD 03/16/16 1926

## 2016-04-21 ENCOUNTER — Encounter: Payer: Self-pay | Admitting: *Deleted

## 2016-04-21 ENCOUNTER — Ambulatory Visit
Admission: EM | Admit: 2016-04-21 | Discharge: 2016-04-21 | Disposition: A | Discharge status: Home / Self Care | Payer: BLUE CROSS/BLUE SHIELD | Attending: Physician Assistant | Admitting: Physician Assistant

## 2016-04-21 DIAGNOSIS — F410 Panic disorder [episodic paroxysmal anxiety] without agoraphobia: Secondary | ICD-10-CM | POA: Diagnosis not present

## 2016-04-21 DIAGNOSIS — F419 Anxiety disorder, unspecified: Secondary | ICD-10-CM

## 2016-04-21 MED ORDER — CLONAZEPAM 0.5 MG PO TABS: 0.5000 mg | ORAL_TABLET | Freq: Two times a day (BID) | ORAL | 0 refills | Status: DC | PRN

## 2016-04-21 NOTE — Discharge Instructions (Signed)
-   Try clonazepam, especially at night to help with sleep and relaxing. Avoid use if needing to drive afterwards. - Increase your nightly trazodone by 50mg  for improved sleep. - Follow up with your PCP for long term care and follow up for your anxiety needs and for referral to counseling/psychiatry. - Should you develop thoughts of hurting yourself or others, present to the emergency room for treatment.
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
## 2016-04-21 NOTE — ED Triage Notes (Addendum)
Over past 2 days pt has experienced several panic attacks. Unable to sleep, 2 hours in past 72 hours. Dealing with increased stress at work and at home.

## 2017-03-02 DIAGNOSIS — M5416 Radiculopathy, lumbar region: Secondary | ICD-10-CM | POA: Insufficient documentation

## 2018-03-29 DIAGNOSIS — M792 Neuralgia and neuritis, unspecified: Secondary | ICD-10-CM | POA: Insufficient documentation

## 2019-03-26 DIAGNOSIS — D509 Iron deficiency anemia, unspecified: Secondary | ICD-10-CM | POA: Insufficient documentation

## 2019-03-27 DIAGNOSIS — K31A0 Gastric intestinal metaplasia, unspecified: Secondary | ICD-10-CM | POA: Insufficient documentation

## 2019-05-10 ENCOUNTER — Other Ambulatory Visit (HOSPITAL_COMMUNITY): Payer: Self-pay | Admitting: Physician Assistant

## 2019-05-10 DIAGNOSIS — M5412 Radiculopathy, cervical region: Secondary | ICD-10-CM

## 2019-05-13 ENCOUNTER — Telehealth (HOSPITAL_COMMUNITY): Payer: Self-pay

## 2019-05-14 ENCOUNTER — Telehealth (HOSPITAL_COMMUNITY): Payer: Self-pay

## 2019-08-19 DIAGNOSIS — T85192A Other mechanical complication of implanted electronic neurostimulator (electrode) of spinal cord, initial encounter: Secondary | ICD-10-CM | POA: Insufficient documentation

## 2019-11-04 ENCOUNTER — Other Ambulatory Visit: Payer: Self-pay | Admitting: Neurosurgery

## 2019-11-04 DIAGNOSIS — Z9689 Presence of other specified functional implants: Secondary | ICD-10-CM

## 2019-11-04 DIAGNOSIS — R109 Unspecified abdominal pain: Secondary | ICD-10-CM

## 2019-11-15 ENCOUNTER — Other Ambulatory Visit: Payer: Self-pay

## 2019-11-15 ENCOUNTER — Ambulatory Visit
Admission: RE | Admit: 2019-11-15 | Discharge: 2019-11-15 | Disposition: A | Discharge status: Home / Self Care | Payer: BC Managed Care – PPO | Source: Ambulatory Visit | Attending: Neurosurgery | Admitting: Neurosurgery

## 2019-11-15 DIAGNOSIS — R109 Unspecified abdominal pain: Secondary | ICD-10-CM | POA: Diagnosis present

## 2019-11-15 DIAGNOSIS — Z9689 Presence of other specified functional implants: Secondary | ICD-10-CM

## 2019-11-21 DIAGNOSIS — M5134 Other intervertebral disc degeneration, thoracic region: Secondary | ICD-10-CM | POA: Insufficient documentation

## 2020-01-09 DIAGNOSIS — K760 Fatty (change of) liver, not elsewhere classified: Secondary | ICD-10-CM | POA: Insufficient documentation

## 2020-02-12 DIAGNOSIS — M4316 Spondylolisthesis, lumbar region: Secondary | ICD-10-CM | POA: Insufficient documentation

## 2020-03-02 DIAGNOSIS — E611 Iron deficiency: Secondary | ICD-10-CM | POA: Insufficient documentation

## 2020-03-02 DIAGNOSIS — E559 Vitamin D deficiency, unspecified: Secondary | ICD-10-CM | POA: Insufficient documentation

## 2020-03-02 DIAGNOSIS — E538 Deficiency of other specified B group vitamins: Secondary | ICD-10-CM | POA: Insufficient documentation

## 2020-04-02 ENCOUNTER — Other Ambulatory Visit: Payer: Self-pay

## 2020-04-02 ENCOUNTER — Ambulatory Visit
Admission: EM | Admit: 2020-04-02 | Discharge: 2020-04-02 | Disposition: A | Discharge status: Home / Self Care | Payer: BC Managed Care – PPO

## 2020-04-02 DIAGNOSIS — B370 Candidal stomatitis: Secondary | ICD-10-CM

## 2020-04-02 DIAGNOSIS — R110 Nausea: Secondary | ICD-10-CM | POA: Diagnosis not present

## 2020-04-02 MED ORDER — ONDANSETRON HCL 4 MG PO TABS: 4.0000 mg | ORAL_TABLET | Freq: Four times a day (QID) | ORAL | 0 refills | Status: AC

## 2020-04-02 MED ORDER — NYSTATIN 100000 UNIT/ML MT SUSP: OROMUCOSAL | 0 refills | Status: DC

## 2020-04-02 NOTE — Discharge Instructions (Signed)
Start the nystatin mouthwash for thrush.  You sure you are using mouthwash and rinse your mouth out after using your Symbicort inhaler every single time.  Increase your rest and fluids.  Have also sent Zofran for the nausea.  Follow-up with [place] as needed for any worsening symptoms or if not getting better over the next 10 days.

## 2020-04-02 NOTE — ED Triage Notes (Signed)
Patient complains of thrush and nausea since starting after surgery. Patient had back surgery on 1/26 and discharged on 2/2. States that she has been having issues since then that she thinks may be related to her inhalers.

## 2020-04-02 NOTE — ED Provider Notes (Signed)
[facility]    CSN: [id number] Arrival date & time: 04/02/20  1152      History   Chief Complaint Chief Complaint  Patient presents with  . Thrush    HPI Elaine [name] is a 40 y.o. female presenting for tongue pain and exudates for the past 2 weeks.  Patient states that she is also felt nauseous during this time.  Patient says that she was in the hospital and recently had a surgery on her back.  She says that she started using a corticosteroid inhaler at that time for asthma.  Patient says that she is not always good about brushing her teeth or rinsing her mouth out after using this inhaler.  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  Patient is on a diabetic and has no immunocompromising diseases.  She denies fever or sore throat.  Denies any difficulty swallowing or mouth swelling.  No cough, congestion or difficulty breathing.  She is being followed by a neurosurgeon for her recent back surgery.  She has no other concerns today.  HPI  Past Medical History:  Diagnosis Date  . Anxiety    panic attack 12/2014  . Complication of anesthesia   . PONV (postoperative nausea and vomiting)   . Pseudocholinesterase deficiency     Patient Active Problem List   Diagnosis Date Noted  . Abnormal uterine bleeding 10/09/2015    Past Surgical History:  Procedure Laterality Date  . APPENDECTOMY    . BACK SURGERY  2014  . CHOLECYSTECTOMY    . CYSTOSCOPY N/A 10/09/2015   Procedure: CYSTOSCOPY;  Surgeon: Christeen Douglas, MD;  Location: [facility];  Service: Gynecology;  Laterality: N/A;  . FRACTURE SURGERY Right 1987   leg  . LAPAROSCOPIC BILATERAL SALPINGECTOMY Bilateral 10/09/2015   Procedure: LAPAROSCOPIC BILATERAL SALPINGECTOMY;  Surgeon: Christeen Douglas, MD;  Location: [facility];  Service: Gynecology;  Laterality: Bilateral;  . LAPAROSCOPIC HYSTERECTOMY N/A 10/09/2015   Procedure: HYSTERECTOMY TOTAL LAPAROSCOPIC;  Surgeon: Christeen Douglas, MD;  Location:  [facility];  Service: Gynecology;  Laterality: N/A;    OB History   No obstetric history on file.      Home Medications    Prior to Admission medications   Medication Sig Start Date End Date Taking? Authorizing Provider  albuterol (VENTOLIN HFA) 108 (90 Base) MCG/ACT inhaler SMARTSIG:2 Inhalation Via Inhaler Every 6 Hours PRN 03/13/20  Yes [provider]  azelastine (ASTELIN) 0.1 % nasal spray Place 1 spray into both nostrils 2 (two) times daily. 03/06/20  Yes [provider]  budesonide-formoterol (SYMBICORT) 80-4.5 MCG/ACT inhaler Inhale into the lungs. 03/24/20 04/23/20 Yes [provider]  cyanocobalamin (,VITAMIN B-12,) 1000 MCG/ML injection Inject 1,000 mcg into the muscle every 30 (thirty) days. 03/05/20  Yes [provider]  cyclobenzaprine (FLEXERIL) 10 MG tablet  04/02/20  Yes [provider]  docusate sodium (COLACE) 100 MG capsule Take 1 capsule (100 mg total) by mouth 2 (two) times daily. 10/09/15  Yes Christeen Douglas, MD  DULoxetine (CYMBALTA) 60 MG capsule Take 60 mg by mouth daily. 12/05/19  Yes [provider]  gabapentin (NEURONTIN) 300 MG capsule Take by mouth. 08/21/19 03/05/21 Yes [provider]  methocarbamol (ROBAXIN) 750 MG tablet Take 750 mg by mouth every 6 (six) hours as needed. 03/24/20  Yes [provider]  morphine (MS CONTIN) 15 MG 12 hr tablet Take 15 mg by mouth 2 (two) times daily. 03/12/20  Yes [provider]  nystatin (MYCOSTATIN) 100000 UNIT/ML suspension Take 5 mL by mouth and swish and retain in mouth as long as possible 4 times a day for 7 to 14 days 04/02/20  Yes Eusebio Friendly B, PA-C  ondansetron (ZOFRAN) 4 MG tablet Take 1 tablet (4 mg total) by mouth every 6 (six) hours for 5 days. 04/02/20 04/07/20 Yes Shirlee Latch, PA-C  oxyCODONE-acetaminophen (PERCOCET) 10-325 MG tablet PLEASE SEE ATTACHED FOR DETAILED DIRECTIONS 02/27/20  Yes [provider]  topiramate (TOPAMAX) 25  MG tablet Take by mouth. 01/28/20  Yes [provider]  traZODone (DESYREL) 100 MG tablet  03/25/20  Yes [provider]  Vitamin D, Ergocalciferol, (DRISDOL) 1.25 MG (50000 UNIT) CAPS capsule Take 50,000 Units by mouth once a week. 03/02/20  Yes [provider]  chlorpheniramine-HYDROcodone (TUSSIONEX PENNKINETIC ER) 10-8 MG/5ML SUER Take 5 mLs by mouth every 12 (twelve) hours as needed. 03/16/16   Payton Mccallum, MD  clonazePAM (KLONOPIN) 0.5 MG tablet Take 1 tablet (0.5 mg total) by mouth 2 (two) times daily as needed for anxiety. 04/21/16   Candis Schatz, PA-C  ibuprofen (ADVIL,MOTRIN) 600 MG tablet Take 1 tablet (600 mg total) by mouth every 6 (six) hours as needed (mild pain). 10/09/15   Christeen Douglas, MD  oseltamivir (TAMIFLU) 75 MG capsule Take 1 capsule (75 mg total) by mouth 2 (two) times daily. 03/16/16   Payton Mccallum, MD  oxyCODONE-acetaminophen (PERCOCET/ROXICET) 5-325 MG tablet Take 1-2 tablets by mouth every 4 (four) hours as needed (moderate to severe pain (when tolerating fluids)). 10/09/15   Christeen Douglas, MD    Family History History reviewed. No pertinent family history.  Social History Social History   Tobacco Use  . Smoking status: Current Every Day Smoker    Packs/day: 1.50    Types: Cigarettes  . Smokeless tobacco: Never Used  Substance Use Topics  . Alcohol use: Yes    Alcohol/week: 3.0 standard drinks    Types: 3 Glasses of wine per week    Comment: socially  . Drug use: No     Allergies   Buspirone, Oxycodone, Succinimides, and Succinylcholine   Review of Systems Review of Systems  Constitutional: Negative for chills, diaphoresis, fatigue and fever.  HENT: Negative for congestion, ear pain, rhinorrhea, sinus pressure, sinus pain and sore throat.        Tongue pain and exudates  Respiratory: Negative for cough and shortness of breath.   Gastrointestinal: Positive for nausea. Negative for abdominal pain and vomiting.   Musculoskeletal: Negative for arthralgias and myalgias.  Skin: Negative for rash.  Neurological: Negative for weakness and headaches.  Hematological: Negative for adenopathy.     Physical Exam Triage Vital Signs ED Triage Vitals  Enc Vitals Group     BP 04/02/20 1214 130/85     Pulse Rate 04/02/20 1214 (!) 101     Resp 04/02/20 1214 18     Temp 04/02/20 1214 98.7 F (37.1 C)     Temp Source 04/02/20 1214 Oral     SpO2 04/02/20 1214 96 %     Weight 04/02/20 1209 269 lb (122 kg)     Height 04/02/20 1209 5\' 7"  (1.702 m)     Head Circumference --      Peak Flow --      Pain Score 04/02/20 1208 7     Pain Loc --      Pain Edu? --      Excl. in GC? --  No data found.  Updated Vital Signs BP 130/85 (BP Location: Left Arm)   Pulse (!) 101   Temp 98.7 F (37.1 C) (Oral)   Resp 18   Ht 5\' 7"  (1.702 m)   Wt 269 lb (122 kg)   LMP 10/07/2015 (Exact Date)   SpO2 96%   BMI 42.13 kg/m       Physical Exam Vitals and nursing note reviewed.  Constitutional:      General: She is not in acute distress.    Appearance: Normal appearance. She is not ill-appearing or toxic-appearing.  HENT:     Head: Normocephalic and atraumatic.     Nose: Nose normal.     Mouth/Throat:     Mouth: Mucous membranes are moist.     Pharynx: Oropharynx is clear.     Comments: + erythema and thick white exudates of tongue Eyes:     General: No scleral icterus.       Right eye: No discharge.        Left eye: No discharge.     Conjunctiva/sclera: Conjunctivae normal.  Cardiovascular:     Rate and Rhythm: Normal rate and regular rhythm.     Heart sounds: Normal heart sounds.  Pulmonary:     Effort: Pulmonary effort is normal. No respiratory distress.     Breath sounds: Normal breath sounds.  Musculoskeletal:     Cervical back: Neck supple.  Skin:    General: Skin is dry.  Neurological:     General: No focal deficit present.     Mental Status: She is alert. Mental status is at baseline.      Motor: No weakness.  Psychiatric:        Mood and Affect: Mood normal.        Behavior: Behavior normal.        Thought Content: Thought content normal.      UC Treatments / Results  Labs (all labs ordered are listed, but only abnormal results are displayed) Labs Reviewed - No data to display  EKG   Radiology No results found.  Procedures Procedures (including critical care time)  Medications Ordered in UC Medications - No data to display  Initial Impression / Assessment and Plan / UC Course  I have reviewed the triage vital signs and the nursing notes.  Pertinent labs & imaging results that were available during my care of the patient were reviewed by me and considered in my medical decision making (see chart for details).   Exam consistent with oral thrush.  Discussed proper use of her corticosteroid inhalers.  Encouraged her to always use mouthwash and rinse her mouth out after using the inhalers.  Advised her to stay hydrated.  Sent Zofran for the nausea which is likely secondary to her thrush.  Advised her to follow-up with our clinic as needed.  Final Clinical Impressions(s) / UC Diagnoses   Final diagnoses:  Oral thrush  Nausea without vomiting     Discharge Instructions     Start the nystatin mouthwash for thrush.  You sure you are using mouthwash and rinse your mouth out after using your Symbicort inhaler every single time.  Increase your rest and fluids.  Have also sent Zofran for the nausea.  Follow-up with 10/09/2015 as needed for any worsening symptoms or if not getting better over the next 10 days.    ED Prescriptions    Medication Sig Dispense Auth. Provider   ondansetron (ZOFRAN) 4 MG tablet Take 1 tablet (4  mg total) by mouth every 6 (six) hours for 5 days. 20 tablet Eusebio Friendly B, PA-C   nystatin (MYCOSTATIN) 100000 UNIT/ML suspension Take 5 mL by mouth and swish and retain in mouth as long as possible 4 times a day for 7 to 14 days 250 mL Shirlee Latch, PA-C     PDMP not reviewed this encounter.   Shirlee Latch, PA-C 04/02/20 1256

## 2020-06-23 ENCOUNTER — Ambulatory Visit: Payer: BC Managed Care – PPO

## 2020-07-09 DIAGNOSIS — E66813 Obesity, class 3: Secondary | ICD-10-CM | POA: Diagnosis present

## 2020-08-20 DIAGNOSIS — K612 Anorectal abscess: Secondary | ICD-10-CM | POA: Insufficient documentation

## 2020-11-05 DIAGNOSIS — K603 Anal fistula: Secondary | ICD-10-CM | POA: Insufficient documentation

## 2021-04-14 ENCOUNTER — Other Ambulatory Visit: Payer: Self-pay | Admitting: Neurosurgery

## 2021-04-14 ENCOUNTER — Other Ambulatory Visit (HOSPITAL_COMMUNITY): Payer: Self-pay | Admitting: Neurosurgery

## 2021-04-14 DIAGNOSIS — Z981 Arthrodesis status: Secondary | ICD-10-CM

## 2021-04-14 DIAGNOSIS — M5416 Radiculopathy, lumbar region: Secondary | ICD-10-CM

## 2021-04-14 DIAGNOSIS — R200 Anesthesia of skin: Secondary | ICD-10-CM

## 2021-05-04 ENCOUNTER — Other Ambulatory Visit: Payer: Self-pay

## 2021-05-04 ENCOUNTER — Ambulatory Visit
Admission: RE | Admit: 2021-05-04 | Discharge: 2021-05-04 | Disposition: A | Discharge status: Home / Self Care | Payer: BC Managed Care – PPO | Source: Ambulatory Visit | Attending: Neurosurgery | Admitting: Neurosurgery

## 2021-05-04 DIAGNOSIS — Z981 Arthrodesis status: Secondary | ICD-10-CM | POA: Insufficient documentation

## 2021-05-04 DIAGNOSIS — M5416 Radiculopathy, lumbar region: Secondary | ICD-10-CM | POA: Insufficient documentation

## 2021-05-04 DIAGNOSIS — R200 Anesthesia of skin: Secondary | ICD-10-CM | POA: Diagnosis present

## 2021-05-26 IMAGING — CT CT L SPINE W/O CM
3 of 4 series · 13 of 33 positions shown, 16 images · non-contrast
Comparison: None.

CLINICAL DATA: Assess spinal cord stimulator. Fall 2 months ago
with flank pain.

EXAM:
CT LUMBAR SPINE WITHOUT CONTRAST
TECHNIQUE: Multidetector CT imaging of the lumbar spine was performed without
intravenous contrast administration. Multiplanar CT image
reconstructions were also generated.

[Series 2: lspine st l-spine 2.00 · axial · 0.36mm/px · z∈[-1355,-1189]mm · 5 of 123 slices shown, 7 images]
[im 21/123  soft-tissue]
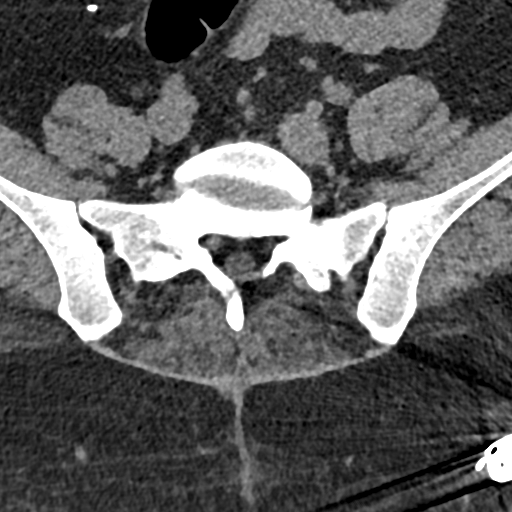
[im 21/123  bone]
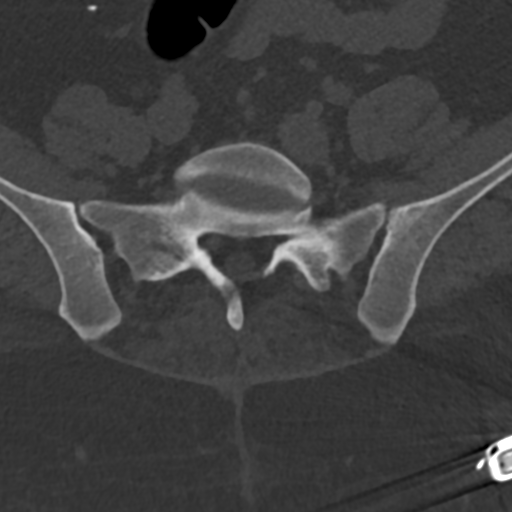
[im 41/123  bone]
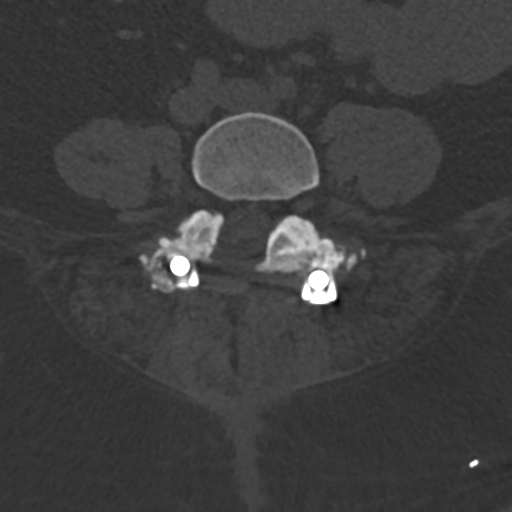
[im 62/123  bone]
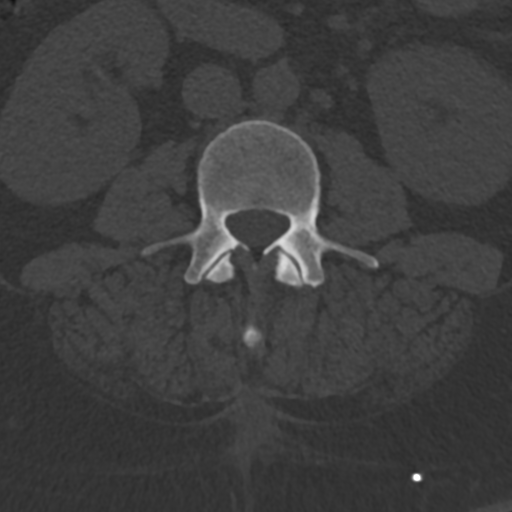
[im 82/123  bone]
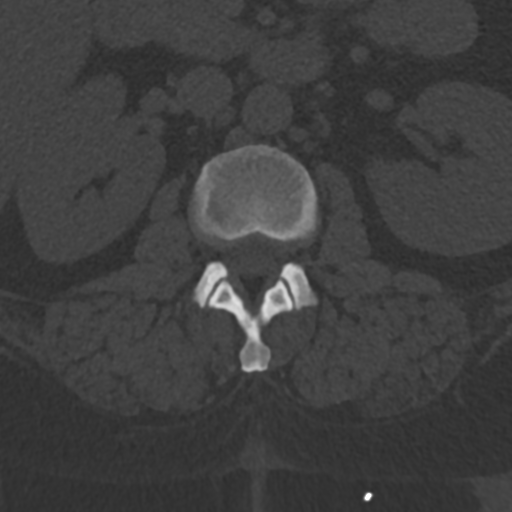
[im 102/123  soft-tissue]
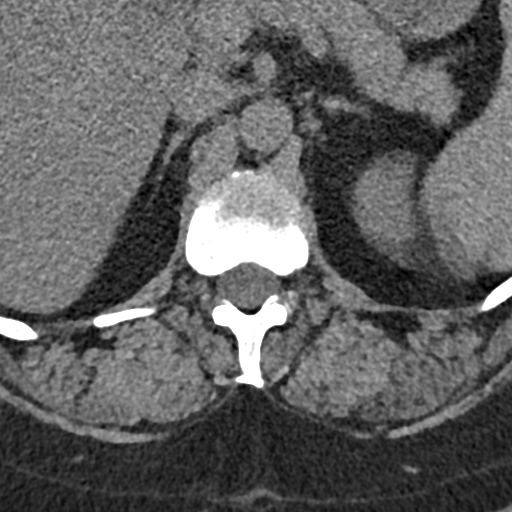
[im 102/123  bone]
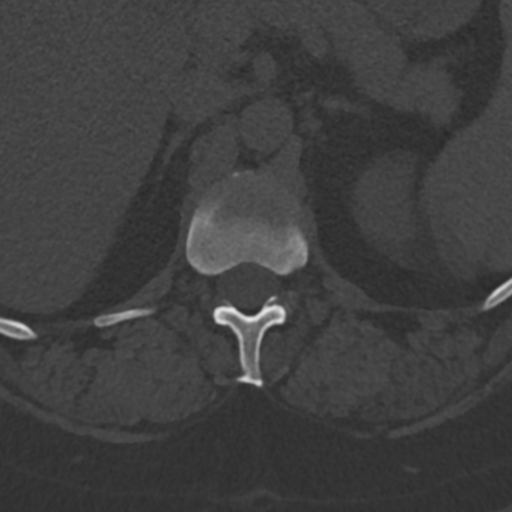

[Series 4: sag bone l-spine 2.00 sag · sagittal · 0.36mm/px · 5 of 93 slices shown, 6 images]
[im 31/93  bone]
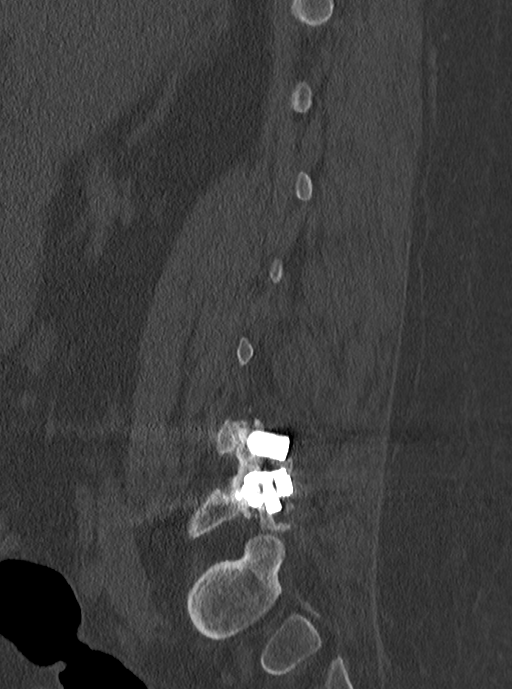
[im 39/93  bone]
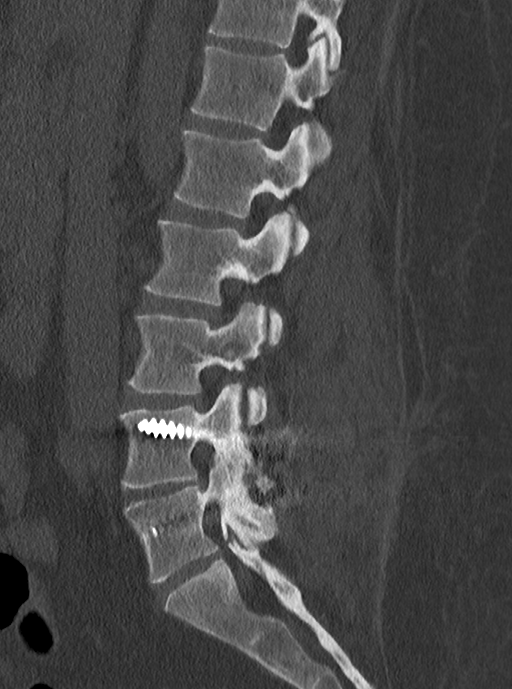
[im 47/93  soft-tissue]
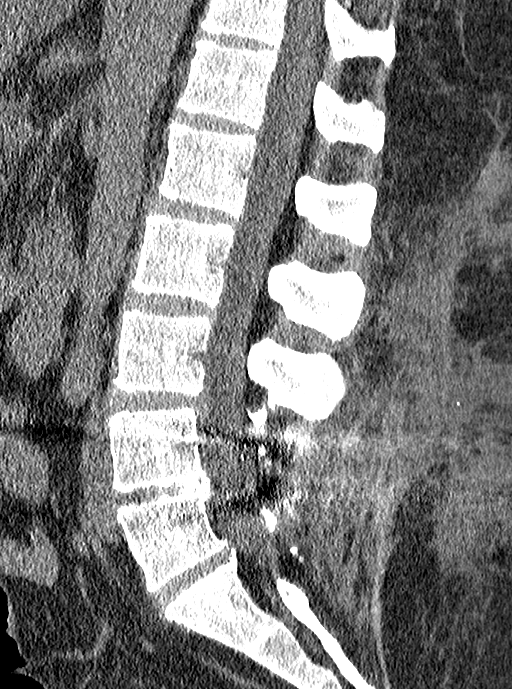
[im 47/93  bone]
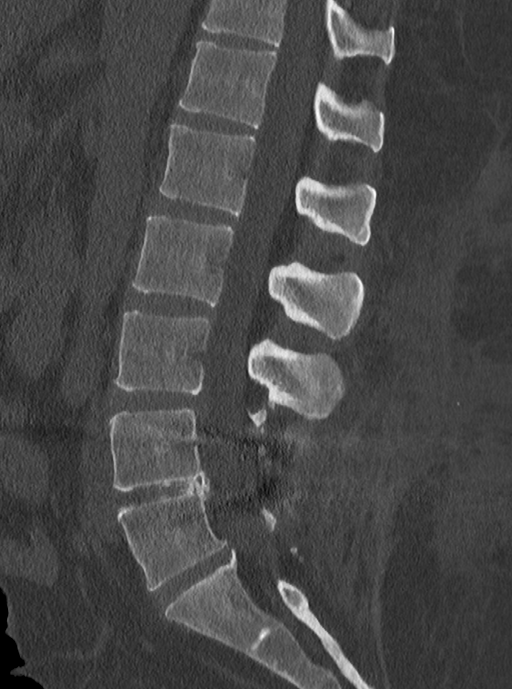
[im 54/93  bone]
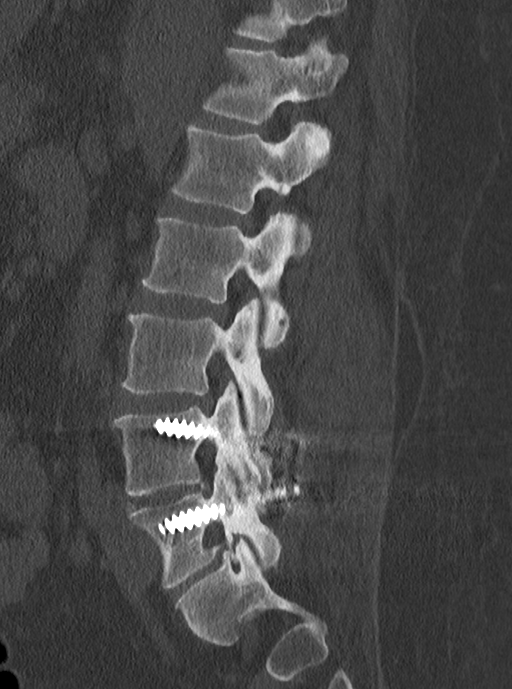
[im 62/93  bone]
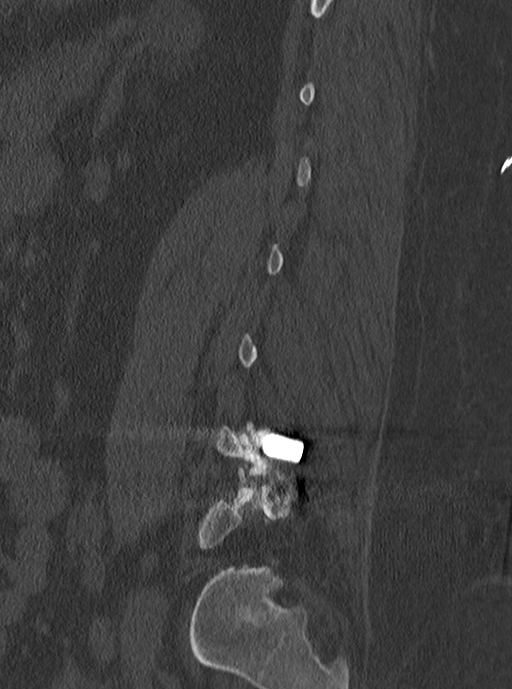

[Series 6: cor bone l-spine 2.00 cor · coronal · 0.36mm/px · 3 of 93 slices shown]
[im 19/93  bone]
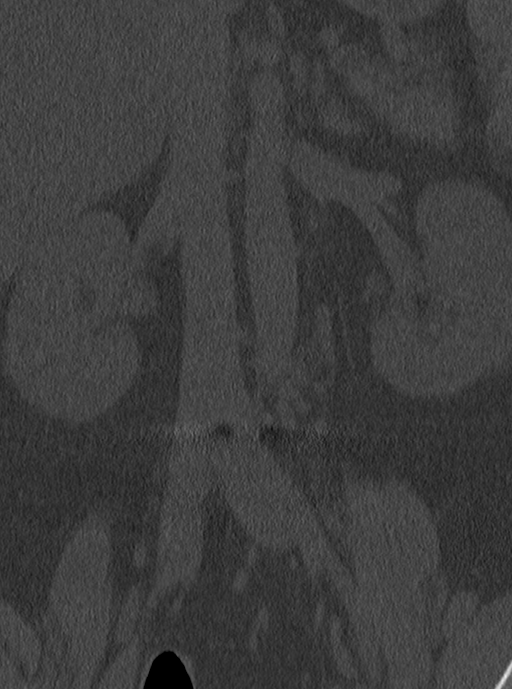
[im 37/93  bone]
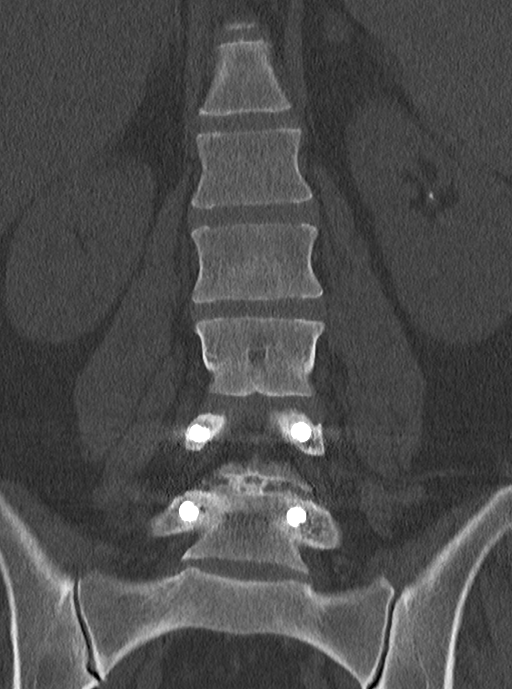
[im 56/93  bone]
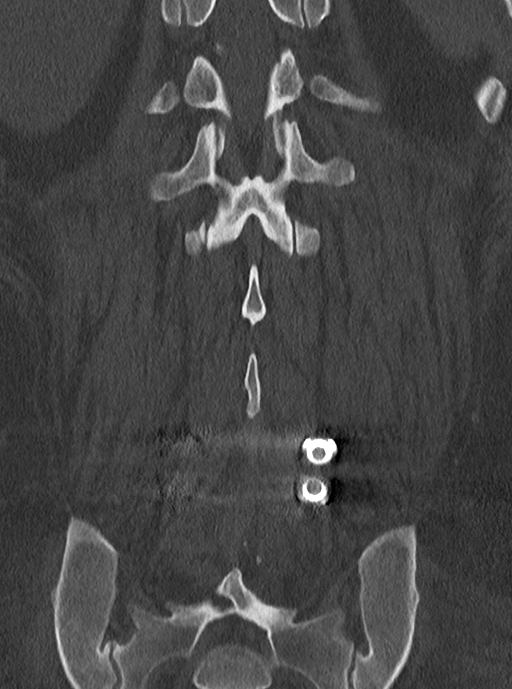

[13 of 33 positions shown; findings below may reference images not displayed]

FINDINGS: Segmentation: 5 lumbar type vertebrae

Alignment: Normal

Vertebrae: L4-5 posterior-lateral fusion and laminectomy with solid
arthrodesis. No fracture or discitis. Spinal stimulator that enters
the canal at the thoracic spine and is not covered on this study.

Paraspinal and other soft tissues: 19 mm left adrenal nodule with 10
Hounsfield unit densitometry, new from 1027 but most suggestive of
adenoma. 2 mm left renal calculus.

Disc levels: Mild degenerative facet spurring at L3-4 and L5-S1. No
visible spinal stenosis.
IMPRESSION: 1. No acute finding.
2. The spinal cord stimulator is in the thoracic spine and not
covered on this study.
3. L4-5 fusion and laminectomy with solid arthrodesis.
4. Left nephrolithiasis.
5. 19 mm left adrenal nodule that is new from 1027 but has
densitometry suggesting adenoma.

## 2021-12-24 DIAGNOSIS — E1165 Type 2 diabetes mellitus with hyperglycemia: Secondary | ICD-10-CM | POA: Diagnosis present

## 2022-01-26 ENCOUNTER — Other Ambulatory Visit: Payer: Self-pay

## 2022-01-26 ENCOUNTER — Inpatient Hospital Stay
Admission: EM | Admit: 2022-01-26 | Discharge: 2022-02-02 | DRG: 202 | Disposition: A | Discharge status: Home / Self Care | Payer: BC Managed Care – PPO | Attending: Internal Medicine | Admitting: Internal Medicine

## 2022-01-26 ENCOUNTER — Emergency Department: Payer: BC Managed Care – PPO

## 2022-01-26 DIAGNOSIS — E8809 Other disorders of plasma-protein metabolism, not elsewhere classified: Secondary | ICD-10-CM | POA: Diagnosis present

## 2022-01-26 DIAGNOSIS — R062 Wheezing: Secondary | ICD-10-CM

## 2022-01-26 DIAGNOSIS — Z7951 Long term (current) use of inhaled steroids: Secondary | ICD-10-CM

## 2022-01-26 DIAGNOSIS — Z1152 Encounter for screening for COVID-19: Secondary | ICD-10-CM

## 2022-01-26 DIAGNOSIS — D72829 Elevated white blood cell count, unspecified: Secondary | ICD-10-CM | POA: Insufficient documentation

## 2022-01-26 DIAGNOSIS — F17210 Nicotine dependence, cigarettes, uncomplicated: Secondary | ICD-10-CM | POA: Diagnosis present

## 2022-01-26 DIAGNOSIS — E1165 Type 2 diabetes mellitus with hyperglycemia: Secondary | ICD-10-CM | POA: Diagnosis not present

## 2022-01-26 DIAGNOSIS — B9789 Other viral agents as the cause of diseases classified elsewhere: Secondary | ICD-10-CM | POA: Diagnosis present

## 2022-01-26 DIAGNOSIS — Z9049 Acquired absence of other specified parts of digestive tract: Secondary | ICD-10-CM

## 2022-01-26 DIAGNOSIS — J4521 Mild intermittent asthma with (acute) exacerbation: Principal | ICD-10-CM | POA: Diagnosis present

## 2022-01-26 DIAGNOSIS — B370 Candidal stomatitis: Secondary | ICD-10-CM | POA: Insufficient documentation

## 2022-01-26 DIAGNOSIS — Z9079 Acquired absence of other genital organ(s): Secondary | ICD-10-CM

## 2022-01-26 DIAGNOSIS — G894 Chronic pain syndrome: Secondary | ICD-10-CM

## 2022-01-26 DIAGNOSIS — J45901 Unspecified asthma with (acute) exacerbation: Secondary | ICD-10-CM | POA: Diagnosis present

## 2022-01-26 DIAGNOSIS — Z90710 Acquired absence of both cervix and uterus: Secondary | ICD-10-CM

## 2022-01-26 DIAGNOSIS — Z90722 Acquired absence of ovaries, bilateral: Secondary | ICD-10-CM

## 2022-01-26 DIAGNOSIS — Z888 Allergy status to other drugs, medicaments and biological substances status: Secondary | ICD-10-CM

## 2022-01-26 DIAGNOSIS — F410 Panic disorder [episodic paroxysmal anxiety] without agoraphobia: Secondary | ICD-10-CM | POA: Diagnosis not present

## 2022-01-26 DIAGNOSIS — Z720 Tobacco use: Secondary | ICD-10-CM | POA: Diagnosis present

## 2022-01-26 DIAGNOSIS — E66813 Obesity, class 3: Secondary | ICD-10-CM | POA: Diagnosis present

## 2022-01-26 DIAGNOSIS — J9601 Acute respiratory failure with hypoxia: Secondary | ICD-10-CM

## 2022-01-26 DIAGNOSIS — Z881 Allergy status to other antibiotic agents status: Secondary | ICD-10-CM

## 2022-01-26 DIAGNOSIS — B348 Other viral infections of unspecified site: Secondary | ICD-10-CM

## 2022-01-26 DIAGNOSIS — Z6841 Body Mass Index (BMI) 40.0 and over, adult: Secondary | ICD-10-CM

## 2022-01-26 DIAGNOSIS — Z79891 Long term (current) use of opiate analgesic: Secondary | ICD-10-CM

## 2022-01-26 DIAGNOSIS — Z79899 Other long term (current) drug therapy: Secondary | ICD-10-CM

## 2022-01-26 DIAGNOSIS — Z885 Allergy status to narcotic agent status: Secondary | ICD-10-CM

## 2022-01-26 LAB — CBC
HCT: 47.2 % — ABNORMAL HIGH (ref 36.0–46.0)
Hemoglobin: 14.5 g/dL (ref 12.0–15.0)
MCH: 24.9 pg — ABNORMAL LOW (ref 26.0–34.0)
MCHC: 30.7 g/dL (ref 30.0–36.0)
MCV: 81.1 fL (ref 80.0–100.0)
Platelets: 191 10*3/uL (ref 150–400)
RBC: 5.82 MIL/uL — ABNORMAL HIGH (ref 3.87–5.11)
RDW: 15.5 % (ref 11.5–15.5)
WBC: 18.2 10*3/uL — ABNORMAL HIGH (ref 4.0–10.5)
nRBC: 0 % (ref 0.0–0.2)

## 2022-01-26 LAB — BASIC METABOLIC PANEL
Anion gap: 8 (ref 5–15)
BUN: 10 mg/dL (ref 6–20)
CO2: 27 mmol/L (ref 22–32)
Calcium: 8.9 mg/dL (ref 8.9–10.3)
Chloride: 101 mmol/L (ref 98–111)
Creatinine, Ser: 0.71 mg/dL (ref 0.44–1.00)
GFR, Estimated: >60 mL/min (ref >60)
Glucose, Bld: 245 mg/dL — ABNORMAL HIGH (ref 70–99)
Potassium: 4.5 mmol/L (ref 3.5–5.1)
Sodium: 136 mmol/L (ref 135–145)

## 2022-01-26 LAB — TROPONIN I (HIGH SENSITIVITY)
Troponin I (High Sensitivity): 5 ng/L (ref <18)
Troponin I (High Sensitivity): 4 ng/L (ref <18)

## 2022-01-26 LAB — D-DIMER, QUANTITATIVE: D-Dimer, Quant: 0.28 ug/mL-FEU (ref 0.00–0.50)

## 2022-01-26 LAB — SARS CORONAVIRUS 2 BY RT PCR: SARS Coronavirus 2 by RT PCR: NEGATIVE

## 2022-01-26 LAB — CBG MONITORING, ED: Glucose-Capillary: 243 mg/dL — ABNORMAL HIGH (ref 70–99)

## 2022-01-26 LAB — BRAIN NATRIURETIC PEPTIDE: B Natriuretic Peptide: 9.1 pg/mL (ref 0.0–100.0)

## 2022-01-26 LAB — PROCALCITONIN: Procalcitonin: <0.1 ng/mL

## 2022-01-26 MED ORDER — SODIUM CHLORIDE 0.9% FLUSH
3.0000 mL | Freq: Two times a day (BID) | INTRAVENOUS | Status: DC
  Administered 2022-01-27 – 2022-02-02 (×13): 3 mL via INTRAVENOUS

## 2022-01-26 MED ORDER — INSULIN ASPART 100 UNIT/ML IJ SOLN
0.0000 [IU] | Freq: Three times a day (TID) | INTRAMUSCULAR | Status: DC
  Administered 2022-01-26: 7 [IU] via SUBCUTANEOUS
  Filled 2022-01-26: qty 1

## 2022-01-26 MED ORDER — INSULIN ASPART 100 UNIT/ML IJ SOLN: 0.0000 [IU] | Freq: Three times a day (TID) | INTRAMUSCULAR | Status: DC

## 2022-01-26 MED ORDER — ACETAMINOPHEN 325 MG PO TABS
650.0000 mg | ORAL_TABLET | Freq: Four times a day (QID) | ORAL | Status: DC | PRN
  Administered 2022-01-29 – 2022-02-01 (×2): 650 mg via ORAL
  Filled 2022-01-26 (×3): qty 2

## 2022-01-26 MED ORDER — SODIUM CHLORIDE 0.9 % IV BOLUS
1000.0000 mL | Freq: Once | INTRAVENOUS | Status: AC
  Administered 2022-01-26: 1000 mL via INTRAVENOUS

## 2022-01-26 MED ORDER — LORATADINE 10 MG PO TABS
10.0000 mg | ORAL_TABLET | Freq: Two times a day (BID) | ORAL | Status: DC
  Administered 2022-01-27 – 2022-02-02 (×13): 10 mg via ORAL
  Filled 2022-01-26 (×13): qty 1

## 2022-01-26 MED ORDER — ENOXAPARIN SODIUM 60 MG/0.6ML IJ SOSY
0.5000 mg/kg | PREFILLED_SYRINGE | INTRAMUSCULAR | Status: DC
  Administered 2022-01-26 – 2022-02-01 (×7): 62.5 mg via SUBCUTANEOUS
  Filled 2022-01-26 (×8): qty 1.2

## 2022-01-26 MED ORDER — IPRATROPIUM-ALBUTEROL 0.5-2.5 (3) MG/3ML IN SOLN
3.0000 mL | Freq: Once | RESPIRATORY_TRACT | Status: AC
  Administered 2022-01-26: 3 mL via RESPIRATORY_TRACT
  Filled 2022-01-26: qty 3

## 2022-01-26 MED ORDER — INSULIN GLARGINE-YFGN 100 UNIT/ML ~~LOC~~ SOLN
15.0000 [IU] | Freq: Every day | SUBCUTANEOUS | Status: DC
  Administered 2022-01-26: 15 [IU] via SUBCUTANEOUS
  Filled 2022-01-26 (×2): qty 0.15

## 2022-01-26 MED ORDER — PREGABALIN 50 MG PO CAPS
100.0000 mg | ORAL_CAPSULE | Freq: Three times a day (TID) | ORAL | Status: DC
  Administered 2022-01-26 – 2022-02-02 (×20): 100 mg via ORAL
  Filled 2022-01-26 (×20): qty 2

## 2022-01-26 MED ORDER — LORAZEPAM 2 MG/ML IJ SOLN
1.0000 mg | Freq: Four times a day (QID) | INTRAMUSCULAR | Status: DC | PRN
  Administered 2022-01-26 – 2022-02-02 (×17): 1 mg via INTRAVENOUS
  Filled 2022-01-26 (×19): qty 1

## 2022-01-26 MED ORDER — NICOTINE 21 MG/24HR TD PT24
21.0000 mg | MEDICATED_PATCH | Freq: Every day | TRANSDERMAL | Status: DC
  Administered 2022-01-26 – 2022-02-02 (×8): 21 mg via TRANSDERMAL
  Filled 2022-01-26 (×8): qty 1

## 2022-01-26 MED ORDER — HALOPERIDOL LACTATE 5 MG/ML IJ SOLN
5.0000 mg | Freq: Four times a day (QID) | INTRAMUSCULAR | Status: DC | PRN
  Administered 2022-01-26: 5 mg via INTRAVENOUS
  Filled 2022-01-26: qty 1

## 2022-01-26 MED ORDER — OXYCODONE-ACETAMINOPHEN 10-325 MG PO TABS: 1.0000 | ORAL_TABLET | Freq: Three times a day (TID) | ORAL | Status: DC

## 2022-01-26 MED ORDER — TIZANIDINE HCL 2 MG PO TABS
2.0000 mg | ORAL_TABLET | Freq: Three times a day (TID) | ORAL | Status: DC | PRN
  Administered 2022-02-01 (×2): 2 mg via ORAL
  Filled 2022-01-26 (×3): qty 1

## 2022-01-26 MED ORDER — LORAZEPAM 2 MG/ML IJ SOLN
2.0000 mg | Freq: Once | INTRAMUSCULAR | Status: AC
  Administered 2022-01-27: 2 mg via INTRAVENOUS
  Filled 2022-01-26: qty 1

## 2022-01-26 MED ORDER — OXYCODONE-ACETAMINOPHEN 5-325 MG PO TABS
1.0000 | ORAL_TABLET | Freq: Two times a day (BID) | ORAL | Status: DC
  Administered 2022-01-26 – 2022-02-02 (×14): 1 via ORAL
  Filled 2022-01-26 (×14): qty 1

## 2022-01-26 MED ORDER — DULOXETINE HCL 30 MG PO CPEP
60.0000 mg | ORAL_CAPSULE | Freq: Every day | ORAL | Status: DC
  Administered 2022-01-27 – 2022-02-02 (×7): 60 mg via ORAL
  Filled 2022-01-26 (×4): qty 2
  Filled 2022-01-26: qty 1
  Filled 2022-01-26 (×2): qty 2

## 2022-01-26 MED ORDER — BUDESONIDE 0.25 MG/2ML IN SUSP
0.2500 mg | Freq: Two times a day (BID) | RESPIRATORY_TRACT | Status: DC
  Administered 2022-01-26 – 2022-02-02 (×13): 0.25 mg via RESPIRATORY_TRACT
  Filled 2022-01-26 (×14): qty 2

## 2022-01-26 MED ORDER — ONDANSETRON HCL 4 MG/2ML IJ SOLN
4.0000 mg | Freq: Four times a day (QID) | INTRAMUSCULAR | Status: DC | PRN
  Administered 2022-01-31: 4 mg via INTRAVENOUS
  Filled 2022-01-26: qty 2

## 2022-01-26 MED ORDER — IPRATROPIUM-ALBUTEROL 0.5-2.5 (3) MG/3ML IN SOLN
3.0000 mL | RESPIRATORY_TRACT | Status: DC
  Administered 2022-01-26 (×2): 3 mL via RESPIRATORY_TRACT
  Filled 2022-01-26 (×2): qty 3

## 2022-01-26 MED ORDER — MORPHINE SULFATE ER 15 MG PO TBCR
15.0000 mg | EXTENDED_RELEASE_TABLET | Freq: Two times a day (BID) | ORAL | Status: DC
  Administered 2022-01-26 – 2022-02-02 (×14): 15 mg via ORAL
  Filled 2022-01-26 (×14): qty 1

## 2022-01-26 MED ORDER — FLUTICASONE PROPIONATE 50 MCG/ACT NA SUSP
2.0000 | Freq: Every day | NASAL | Status: DC
  Administered 2022-01-27 – 2022-02-02 (×7): 2 via NASAL
  Filled 2022-01-26 (×2): qty 16

## 2022-01-26 MED ORDER — ONDANSETRON HCL 4 MG PO TABS: 4.0000 mg | ORAL_TABLET | Freq: Four times a day (QID) | ORAL | Status: DC | PRN

## 2022-01-26 MED ORDER — AZELASTINE HCL 0.1 % NA SOLN
1.0000 | Freq: Two times a day (BID) | NASAL | Status: DC
  Administered 2022-01-27 – 2022-02-02 (×12): 1 via NASAL
  Filled 2022-01-26 (×2): qty 30

## 2022-01-26 MED ORDER — ACETAMINOPHEN 650 MG RE SUPP: 650.0000 mg | Freq: Four times a day (QID) | RECTAL | Status: DC | PRN

## 2022-01-26 MED ORDER — OXYCODONE HCL 5 MG PO TABS
5.0000 mg | ORAL_TABLET | Freq: Two times a day (BID) | ORAL | Status: DC
  Administered 2022-01-26 – 2022-02-02 (×14): 5 mg via ORAL
  Filled 2022-01-26 (×14): qty 1

## 2022-01-26 MED ORDER — HYDROXYZINE HCL 25 MG PO TABS
25.0000 mg | ORAL_TABLET | Freq: Three times a day (TID) | ORAL | Status: DC
  Administered 2022-01-26 – 2022-02-02 (×20): 25 mg via ORAL
  Filled 2022-01-26 (×20): qty 1

## 2022-01-26 MED ORDER — METHYLPREDNISOLONE SODIUM SUCC 125 MG IJ SOLR
125.0000 mg | Freq: Once | INTRAMUSCULAR | Status: AC
  Administered 2022-01-26: 125 mg via INTRAVENOUS
  Filled 2022-01-26: qty 2

## 2022-01-26 MED ORDER — POLYETHYLENE GLYCOL 3350 17 G PO PACK: 17.0000 g | PACK | Freq: Every day | ORAL | Status: DC | PRN

## 2022-01-26 NOTE — Assessment & Plan Note (Addendum)
-  Nicotine patch 

## 2022-01-26 NOTE — Assessment & Plan Note (Addendum)
Rhinovirus infection likely the trigger.  Supportive care.  Increase Solu-Medrol  to 40 mg daily.  Continue budesonide and Xopenex nebulizers.  Too much bronchospasm to go home today.

## 2022-01-26 NOTE — ED Notes (Signed)
MD at bedside. 

## 2022-01-26 NOTE — ED Provider Notes (Signed)
[facility] Provider Note    Event Date/Time   First MD Initiated Contact with Patient 01/26/22 1655     (approximate)   History   Shortness of Breath   HPI  Elaine [name] is a 41 y.o. female here with shortness of breath.  The patient states that over the last several days, she has had progressively worsening cough with wheezing and shortness of breath.  The patient states she has remote history of asthma but it is not severe.  Over the last several days she has had cough and wheezing.  She took her oxygen at home and it was as low as the 70s.  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  She states that she saw her primary on Monday and was prescribed steroids and an antibiotic.  She is not able to get them until today.  However, she noted persistence in the 70s at home so she presents for evaluation.  She feels generally fatigued.  She had some subjective fever chills but no known fevers.  No nausea vomiting.  No known sick contacts.     Physical Exam   Triage Vital Signs: ED Triage Vitals  Enc Vitals Group     BP 01/26/22 1639 (!) 161/95     Pulse Rate 01/26/22 1639 (!) 110     Resp 01/26/22 1639 (!) 23     Temp 01/26/22 1639 98 F (36.7 C)     Temp src --      SpO2 01/26/22 1639 (!) 87 %     Weight --      Height --      Head Circumference --      Peak Flow --      Pain Score 01/26/22 1642 5     Pain Loc --      Pain Edu? --      Excl. in GC? --     Most recent vital signs: Vitals:   01/26/22 1800 01/26/22 1804  BP:  (!) 145/107  Pulse: 100 (!) 101  Resp: 13 16  Temp:    SpO2: 96% 96%     General: Awake, no distress.  CV:  Good peripheral perfusion.  Tachycardic. Resp:  Slight increased work of breathing, with diffuse wheezing and diminished aeration. Abd:  No distention.  No tenderness. Other:  No lower extremity edema   ED Results / Procedures / Treatments   Labs (all labs ordered are listed, but only abnormal results are  displayed) Labs Reviewed  BASIC METABOLIC PANEL - Abnormal; Notable for the following components:      Result Value   Glucose, Bld 245 (*)    All other components within normal limits  CBC - Abnormal; Notable for the following components:   WBC 18.2 (*)    RBC 5.82 (*)    HCT 47.2 (*)    MCH 24.9 (*)    All other components within normal limits  SARS CORONAVIRUS 2 BY RT PCR  D-DIMER, QUANTITATIVE  BRAIN NATRIURETIC PEPTIDE  PROCALCITONIN  POC URINE PREG, ED  TROPONIN I (HIGH SENSITIVITY)  TROPONIN I (HIGH SENSITIVITY)     EKG Sinus tachycardia, ventricular rate 102.  PR 198, QRS 74, QTc 461.  No acute ST elevations or depressions.  No EKG evidence of acute ischemia or infarct.   RADIOLOGY Chest x-ray: Clear   I also independently reviewed and agree with radiologist interpretations.   PROCEDURES:  Critical Care performed: Yes, see critical  care procedure note(s)  .Critical Care  Performed by: Shaune Pollack, MD Authorized by: Shaune Pollack, MD   Critical care provider statement:    Critical care time (minutes):  30   Critical care time was exclusive of:  Separately billable procedures and treating other patients and teaching time   Critical care was necessary to treat or prevent imminent or life-threatening deterioration of the following conditions:  Cardiac failure, circulatory failure and respiratory failure   Critical care was time spent personally by me on the following activities:  Development of treatment plan with patient or surrogate, discussions with consultants, evaluation of patient's response to treatment, examination of patient, ordering and review of laboratory studies, ordering and review of radiographic studies, ordering and performing treatments and interventions, pulse oximetry, re-evaluation of patient's condition and review of old charts   Care discussed with: admitting provider       MEDICATIONS ORDERED IN ED: Medications  methylPREDNISolone  sodium succinate (SOLU-MEDROL) 125 mg/2 mL injection 125 mg (125 mg Intravenous Given 01/26/22 1747)  ipratropium-albuterol (DUONEB) 0.5-2.5 (3) MG/3ML nebulizer solution 3 mL (3 mLs Nebulization Given 01/26/22 1749)  ipratropium-albuterol (DUONEB) 0.5-2.5 (3) MG/3ML nebulizer solution 3 mL (3 mLs Nebulization Given 01/26/22 1748)  ipratropium-albuterol (DUONEB) 0.5-2.5 (3) MG/3ML nebulizer solution 3 mL (3 mLs Nebulization Given 01/26/22 1748)  sodium chloride 0.9 % bolus 1,000 mL (1,000 mLs Intravenous New Bag/Given 01/26/22 1801)     IMPRESSION / MDM / ASSESSMENT AND PLAN / ED COURSE  I reviewed the triage vital signs and the nursing notes.                              Differential diagnosis includes, but is not limited to, asthma/RAD exacerbation, URI, CAP, PE, CHF, ACS, anemia, pneumonitis.  Patient's presentation is most consistent with acute presentation with potential threat to life or bodily function.  The patient is on the cardiac monitor to evaluate for evidence of arrhythmia and/or significant heart rate changes.  41 yo F here with cough, wheezing, hypoxia. Clinically, pt has likely asthma exacerbation/RAD with diffuse wheezing and hypoxia. COVID negative. D-Dimer negative. Procal negative, no signs of CAP. BMP with normal renal function. CBC with moderate leukocytosis. Trop neg, BNP normal, doubt CHF or ACS. CXR is clear. Will admit for hypoxia, wheezing. Breathing Tx x 3 given, IV steroids, and supplemental O2 for sats in 80s on RA.      FINAL CLINICAL IMPRESSION(S) / ED DIAGNOSES   Final diagnoses:  Acute respiratory failure with hypoxia (HCC)  Wheezing     Rx / DC Orders   ED Discharge Orders     None        Note:  This document was prepared using Dragon voice recognition software and may include unintentional dictation errors.   Shaune Pollack, MD 01/26/22 [phone]

## 2022-01-26 NOTE — ED Notes (Signed)
Pt transported to xray 

## 2022-01-26 NOTE — ED Triage Notes (Signed)
Pt comes with c/o increased sob for last few days. Pt went to PCP and was seen covid was neg. Pt was informed to come to ED if it got worse.  Pt at 87% RA and placed on 2L. Pt has some labored breathing noted. Pt denies any fever. Pt state some CP 5/10. Pt has had dry nonproductive cough.

## 2022-01-26 NOTE — ED Notes (Signed)
Per Barbara Cower in pharmacy, this amount of pan medication is not excessive for this pt.

## 2022-01-26 NOTE — Assessment & Plan Note (Signed)
-   Continue home scheduled morphine, oxycodone and Lyrica with as needed tizanidine

## 2022-01-26 NOTE — H&P (Signed)
History and Physical    Patient: Elaine [name] CXK:[id number] DOB: [birth year] DOA: 01/26/2022 DOS: the patient was seen and examined on 01/26/2022 PCP: [name]  Patient coming from: Home  Chief Complaint:  Chief Complaint  Patient presents with   Shortness of Breath   HPI: Elaine [name] is a 41 y.o. female with medical history significant of mild intermittent asthma, type 2 diabetes, chronic pain syndrome, migraines, who presents to the ED with complaints of shortness of breath.  Elaine [name] states that on 12/3, she noticed that she was developing a sore throat, rhinorrhea and sinus congestion.  Over the next 24 hours, she gradually began to develop cough with shortness of breath.  On the evening of 12/4, she noticed that she was working very hard to breathe and checked her pulse oximetry at home.  She states her oxygen saturation was in the 70s.  Due to this, she made an appointment with her PCP today.  At that appointment, her PCP started her on prednisone and Symbicort.  However due to persistent desaturations while at home, she came to the ED.  She has not yet started the prednisone.  She states that 10 years ago, she had an episode of asthma exacerbation with pneumonia but otherwise denies any recent flares of her asthma.  ED course: On arrival to the ED, patient was saturating at 87% with improvement to 93% on 2 L of nasal cannula.  She was hypertensive at 161/95 with heart rate of 108. Initial workup remarkable for WBC of 18.2 and glucose of 245.  Procalcitonin negative at 0.1.  COVID-19 PCR negative.  Chest x-ray negative.  D-dimer negative.  TRH contacted for admission for asthma exacerbation.  Review of Systems: As mentioned in the history of present illness. All other systems reviewed and are negative.  Past Medical History:  Diagnosis Date   Anxiety    panic attack 12/2014   Complication of anesthesia    PONV (postoperative nausea and vomiting)     Pseudocholinesterase deficiency    Past Surgical History:  Procedure Laterality Date   APPENDECTOMY     BACK SURGERY  2014   CHOLECYSTECTOMY     CYSTOSCOPY N/A 10/09/2015   Procedure: CYSTOSCOPY;  Surgeon: Christeen Douglas, MD;  Location: [facility];  Service: Gynecology;  Laterality: N/A;   FRACTURE SURGERY Right 1987   leg   LAPAROSCOPIC BILATERAL SALPINGECTOMY Bilateral 10/09/2015   Procedure: LAPAROSCOPIC BILATERAL SALPINGECTOMY;  Surgeon: Christeen Douglas, MD;  Location: [facility];  Service: Gynecology;  Laterality: Bilateral;   LAPAROSCOPIC HYSTERECTOMY N/A 10/09/2015   Procedure: HYSTERECTOMY TOTAL LAPAROSCOPIC;  Surgeon: Christeen Douglas, MD;  Location: [facility];  Service: Gynecology;  Laterality: N/A;   Social History:  reports that she has been smoking cigarettes. She has been smoking an average of 1.5 packs per day. She has never used smokeless tobacco. She reports current alcohol use of about 3.0 standard drinks of alcohol per week. She reports that she does not use drugs.  Allergies  Allergen Reactions   Amoxicillin-Pot Clavulanate Anaphylaxis    Swelling of lips, tongue, and face   Succinylcholine Other (See Comments)    Unknown  Pseudocholinesterase deficiency  Coded and prolonged intubation-pseudocholinesterase deficiency  Pseudocholinesterase def.   Coded and prolonged intubation   Buspirone Other (See Comments)   Oxycodone     Causes hyperactive feeling and problems resting/sleeping with the 7.5mg  dose, able to take lower dosages ok Causes hyperactive feeling and problems resting/sleeping    Succinimides  Other reaction(s): Unknown    No family history on file.  Prior to Admission medications   Medication Sig Start Date End Date Taking? Authorizing Provider  albuterol (VENTOLIN HFA) 108 (90 Base) MCG/ACT inhaler SMARTSIG:2 Inhalation Via Inhaler Every 6 Hours PRN 03/13/20   [provider]  azelastine (ASTELIN) 0.1 % nasal spray Place 1 spray into both  nostrils 2 (two) times daily. 03/06/20   [provider]  budesonide-formoterol (SYMBICORT) 80-4.5 MCG/ACT inhaler Inhale into the lungs. 03/24/20 04/23/20  [provider]  chlorpheniramine-HYDROcodone (TUSSIONEX PENNKINETIC ER) 10-8 MG/5ML SUER Take 5 mLs by mouth every 12 (twelve) hours as needed. 03/16/16   Payton Mccallum, MD  clonazePAM (KLONOPIN) 0.5 MG tablet Take 1 tablet (0.5 mg total) by mouth 2 (two) times daily as needed for anxiety. 04/21/16   Candis Schatz, PA-C  cyanocobalamin (,VITAMIN B-12,) 1000 MCG/ML injection Inject 1,000 mcg into the muscle every 30 (thirty) days. 03/05/20   [provider]  cyclobenzaprine (FLEXERIL) 10 MG tablet  04/02/20   [provider]  docusate sodium (COLACE) 100 MG capsule Take 1 capsule (100 mg total) by mouth 2 (two) times daily. 10/09/15   Christeen Douglas, MD  DULoxetine (CYMBALTA) 60 MG capsule Take 60 mg by mouth daily. 12/05/19   [provider]  gabapentin (NEURONTIN) 300 MG capsule Take by mouth. 08/21/19 03/05/21  [provider]  ibuprofen (ADVIL,MOTRIN) 600 MG tablet Take 1 tablet (600 mg total) by mouth every 6 (six) hours as needed (mild pain). 10/09/15   Christeen Douglas, MD  methocarbamol (ROBAXIN) 750 MG tablet Take 750 mg by mouth every 6 (six) hours as needed. 03/24/20   [provider]  morphine (MS CONTIN) 15 MG 12 hr tablet Take 15 mg by mouth 2 (two) times daily. 03/12/20   [provider]  nystatin (MYCOSTATIN) 100000 UNIT/ML suspension Take 5 mL by mouth and swish and retain in mouth as long as possible 4 times a day for 7 to 14 days 04/02/20   Shirlee Latch, PA-C  oseltamivir (TAMIFLU) 75 MG capsule Take 1 capsule (75 mg total) by mouth 2 (two) times daily. 03/16/16   Payton Mccallum, MD  oxyCODONE-acetaminophen (PERCOCET) 10-325 MG tablet PLEASE SEE ATTACHED FOR DETAILED DIRECTIONS 02/27/20   [provider]  oxyCODONE-acetaminophen (PERCOCET/ROXICET) 5-325 MG  tablet Take 1-2 tablets by mouth every 4 (four) hours as needed (moderate to severe pain (when tolerating fluids)). Patient not taking: Reported on 01/26/2022 10/09/15   Christeen Douglas, MD  topiramate (TOPAMAX) 25 MG tablet Take by mouth. 01/28/20   [provider]  traZODone (DESYREL) 100 MG tablet  03/25/20   [provider]  Vitamin D, Ergocalciferol, (DRISDOL) 1.25 MG (50000 UNIT) CAPS capsule Take 50,000 Units by mouth once a week. 03/02/20   [provider]    Physical Exam: Vitals:   01/26/22 1830 01/26/22 1930 01/26/22 2035 01/26/22 2122  BP: 126/72 131/88    Pulse: 100 94    Resp: (!) 22 20    Temp:    97.6 F (36.4 C)  TempSrc:    Oral  SpO2: 91% 92%    Weight:   123.6 kg   Height:   5\' 7"  (1.702 m)    Physical Exam Vitals and nursing note reviewed.  Constitutional:      Appearance: She is obese. She is ill-appearing.  HENT:     Head: Normocephalic and atraumatic.  Eyes:     Extraocular Movements: Extraocular movements intact.  Pupils: Pupils are equal, round, and reactive to light.  Cardiovascular:     Rate and Rhythm: Normal rate and regular rhythm.     Heart sounds: No murmur heard. Pulmonary:     Effort: Tachypnea and accessory muscle usage present.     Breath sounds: Decreased breath sounds (Throughout posterior lung fields) and wheezing (On anterior lung exam) present. No rhonchi or rales.  Abdominal:     General: Bowel sounds are normal.     Palpations: Abdomen is soft.     Tenderness: There is no abdominal tenderness. There is no guarding.  Musculoskeletal:     Cervical back: Neck supple.     Right lower leg: No edema.     Left lower leg: No edema.  Skin:    General: Skin is warm and dry.  Neurological:     General: No focal deficit present.     Mental Status: She is alert and oriented to person, place, and time.     Motor: Tremor (Bilateral) present.  Psychiatric:        Mood and Affect: Mood is anxious.        Behavior:  Behavior normal.     Data Reviewed: CBC with WBC of 18.2, hemoglobin of 14.5, platelets of 191.  BMP with glucose of 245, but no other electrolyte abnormalities or renal dysfunction.  Troponin, BNP and D-dimer negative.  Procalcitonin less than 0.10.  COVID-19 PCR negative.    EKG personally reviewed.  Sinus rhythm with heart rate of 102.  No acute ST or T wave changes concerning for acute ischemia.  DG Chest 2 View  Result Date: 01/26/2022 CLINICAL DATA:  Increasing shortness of breath for several days, hypoxia EXAM: CHEST - 2 VIEW COMPARISON:  01/15/2015 FINDINGS: Frontal and lateral views of the chest demonstrate an unremarkable cardiac silhouette. No airspace disease, effusion, or pneumothorax. No acute bony abnormalities. Spinal stimulator lead within the lower thoracic central canal. IMPRESSION: 1. No acute intrathoracic process. Electronically Signed   By: Sharlet SalinaMichael  Brown M.D.   On: 01/26/2022 17:21    Results are pending, will review when available.  Assessment and Plan: * Asthma exacerbation Patient presenting with 3-day history of URI symptoms that have progressed to shortness of breath with examination consistent with asthma exacerbation.  Given negative chest x-ray and procalcitonin, likely viral in nature.  Will hold off on initiating antimicrobials at this time.  - S/p Solu-Medrol 125 - Start prednisone 60 mg daily tomorrow with slow taper - DuoNebs every 4 hours - Budesonide nebulizer twice daily - Due to previous severe anxiety and agitation with steroids, Ativan and Haldol as needed - RVP panel pending - Will need to add ICS on discharge due to increased mortality with SABA use only  Uncontrolled type 2 diabetes mellitus with hyperglycemia (HCC) - SSI, resistant - Semglee 15 units prior to bedtime  Chronic pain syndrome - Continue home scheduled morphine, oxycodone and Lyrica with as needed tizanidine  Tobacco abuse - Nicotine patch ordered as requested by  patient  Advance Care Planning:   Code Status: Full Code   Consults: None  Family Communication: Patient's husband updated at bedside  Severity of Illness: The appropriate patient status for this patient is OBSERVATION. Observation status is judged to be reasonable and necessary in order to provide the required intensity of service to ensure the patient's safety. The patient's presenting symptoms, physical exam findings, and initial radiographic and laboratory data in the context of their medical condition is felt to  place them at decreased risk for further clinical deterioration. Furthermore, it is anticipated that the patient will be medically stable for discharge from the hospital within 2 midnights of admission.   Author: Verdene Lennert, MD 01/26/2022 9:38 PM  For on call review [email or web address].

## 2022-01-26 NOTE — Assessment & Plan Note (Addendum)
Hemoglobin A1c 9.1 as outpatient on 12/22/2021.  With yesterday's sugar being on the lower side but I did cut back on the long-acting insulin last night.  Will increase back to 22 units in the evening.

## 2022-01-26 NOTE — ED Notes (Signed)
Pt placed on 2L [place] with O2 now at 90

## 2022-01-27 ENCOUNTER — Encounter: Payer: Self-pay | Admitting: Internal Medicine

## 2022-01-27 DIAGNOSIS — Z888 Allergy status to other drugs, medicaments and biological substances status: Secondary | ICD-10-CM | POA: Diagnosis not present

## 2022-01-27 DIAGNOSIS — Z9079 Acquired absence of other genital organ(s): Secondary | ICD-10-CM | POA: Diagnosis not present

## 2022-01-27 DIAGNOSIS — F17210 Nicotine dependence, cigarettes, uncomplicated: Secondary | ICD-10-CM | POA: Diagnosis present

## 2022-01-27 DIAGNOSIS — J9601 Acute respiratory failure with hypoxia: Secondary | ICD-10-CM | POA: Diagnosis present

## 2022-01-27 DIAGNOSIS — Z79891 Long term (current) use of opiate analgesic: Secondary | ICD-10-CM | POA: Diagnosis not present

## 2022-01-27 DIAGNOSIS — G894 Chronic pain syndrome: Secondary | ICD-10-CM | POA: Diagnosis present

## 2022-01-27 DIAGNOSIS — B370 Candidal stomatitis: Secondary | ICD-10-CM | POA: Diagnosis not present

## 2022-01-27 DIAGNOSIS — Z7951 Long term (current) use of inhaled steroids: Secondary | ICD-10-CM | POA: Diagnosis not present

## 2022-01-27 DIAGNOSIS — Z881 Allergy status to other antibiotic agents status: Secondary | ICD-10-CM | POA: Diagnosis not present

## 2022-01-27 DIAGNOSIS — Z9049 Acquired absence of other specified parts of digestive tract: Secondary | ICD-10-CM | POA: Diagnosis not present

## 2022-01-27 DIAGNOSIS — Z885 Allergy status to narcotic agent status: Secondary | ICD-10-CM | POA: Diagnosis not present

## 2022-01-27 DIAGNOSIS — Z6841 Body Mass Index (BMI) 40.0 and over, adult: Secondary | ICD-10-CM | POA: Diagnosis not present

## 2022-01-27 DIAGNOSIS — Z90722 Acquired absence of ovaries, bilateral: Secondary | ICD-10-CM | POA: Diagnosis not present

## 2022-01-27 DIAGNOSIS — Z1152 Encounter for screening for COVID-19: Secondary | ICD-10-CM | POA: Diagnosis not present

## 2022-01-27 DIAGNOSIS — B348 Other viral infections of unspecified site: Secondary | ICD-10-CM | POA: Diagnosis not present

## 2022-01-27 DIAGNOSIS — Z720 Tobacco use: Secondary | ICD-10-CM

## 2022-01-27 DIAGNOSIS — Z90710 Acquired absence of both cervix and uterus: Secondary | ICD-10-CM | POA: Diagnosis not present

## 2022-01-27 DIAGNOSIS — R0609 Other forms of dyspnea: Secondary | ICD-10-CM | POA: Diagnosis not present

## 2022-01-27 DIAGNOSIS — J4521 Mild intermittent asthma with (acute) exacerbation: Secondary | ICD-10-CM | POA: Diagnosis present

## 2022-01-27 DIAGNOSIS — E1165 Type 2 diabetes mellitus with hyperglycemia: Secondary | ICD-10-CM | POA: Diagnosis present

## 2022-01-27 DIAGNOSIS — R062 Wheezing: Secondary | ICD-10-CM | POA: Diagnosis present

## 2022-01-27 DIAGNOSIS — B9789 Other viral agents as the cause of diseases classified elsewhere: Secondary | ICD-10-CM | POA: Diagnosis present

## 2022-01-27 DIAGNOSIS — F410 Panic disorder [episodic paroxysmal anxiety] without agoraphobia: Secondary | ICD-10-CM | POA: Diagnosis not present

## 2022-01-27 DIAGNOSIS — J4541 Moderate persistent asthma with (acute) exacerbation: Secondary | ICD-10-CM | POA: Diagnosis not present

## 2022-01-27 DIAGNOSIS — Z79899 Other long term (current) drug therapy: Secondary | ICD-10-CM | POA: Diagnosis not present

## 2022-01-27 DIAGNOSIS — D72829 Elevated white blood cell count, unspecified: Secondary | ICD-10-CM | POA: Diagnosis present

## 2022-01-27 DIAGNOSIS — E8809 Other disorders of plasma-protein metabolism, not elsewhere classified: Secondary | ICD-10-CM | POA: Diagnosis present

## 2022-01-27 LAB — CBC WITH DIFFERENTIAL/PLATELET
Abs Immature Granulocytes: 1.36 10*3/uL — ABNORMAL HIGH (ref 0.00–0.07)
Basophils Absolute: 0.2 10*3/uL — ABNORMAL HIGH (ref 0.0–0.1)
Basophils Relative: 1 %
Eosinophils Absolute: 0 10*3/uL (ref 0.0–0.5)
Eosinophils Relative: 0 %
HCT: 49.6 % — ABNORMAL HIGH (ref 36.0–46.0)
Hemoglobin: 14.6 g/dL (ref 12.0–15.0)
Immature Granulocytes: 6 %
Lymphocytes Relative: 11 %
Lymphs Abs: 2.6 10*3/uL (ref 0.7–4.0)
MCH: 24.4 pg — ABNORMAL LOW (ref 26.0–34.0)
MCHC: 29.4 g/dL — ABNORMAL LOW (ref 30.0–36.0)
MCV: 82.8 fL (ref 80.0–100.0)
Monocytes Absolute: 0.9 10*3/uL (ref 0.1–1.0)
Monocytes Relative: 4 %
Neutro Abs: 19.2 10*3/uL — ABNORMAL HIGH (ref 1.7–7.7)
Neutrophils Relative %: 78 %
Platelets: 355 10*3/uL (ref 150–400)
RBC: 5.99 MIL/uL — ABNORMAL HIGH (ref 3.87–5.11)
RDW: 15.3 % (ref 11.5–15.5)
Smear Review: NORMAL
WBC: 24.2 10*3/uL — ABNORMAL HIGH (ref 4.0–10.5)
nRBC: 0 % (ref 0.0–0.2)

## 2022-01-27 LAB — RESPIRATORY PANEL BY PCR

## 2022-01-27 LAB — BASIC METABOLIC PANEL
Anion gap: 9 (ref 5–15)
BUN: 9 mg/dL (ref 6–20)
CO2: 22 mmol/L (ref 22–32)
Calcium: 7.7 mg/dL — ABNORMAL LOW (ref 8.9–10.3)
Chloride: 107 mmol/L (ref 98–111)
Creatinine, Ser: 0.63 mg/dL (ref 0.44–1.00)
GFR, Estimated: >60 mL/min (ref >60)
Glucose, Bld: 208 mg/dL — ABNORMAL HIGH (ref 70–99)
Potassium: 3.9 mmol/L (ref 3.5–5.1)
Sodium: 138 mmol/L (ref 135–145)

## 2022-01-27 LAB — CBG MONITORING, ED
Glucose-Capillary: 144 mg/dL — ABNORMAL HIGH (ref 70–99)
Glucose-Capillary: 156 mg/dL — ABNORMAL HIGH (ref 70–99)
Glucose-Capillary: 217 mg/dL — ABNORMAL HIGH (ref 70–99)
Glucose-Capillary: 249 mg/dL — ABNORMAL HIGH (ref 70–99)
Glucose-Capillary: 344 mg/dL — ABNORMAL HIGH (ref 70–99)

## 2022-01-27 MED ORDER — INSULIN ASPART 100 UNIT/ML IJ SOLN
8.0000 [IU] | Freq: Three times a day (TID) | INTRAMUSCULAR | Status: DC
  Administered 2022-01-27 – 2022-01-30 (×9): 8 [IU] via SUBCUTANEOUS
  Filled 2022-01-27 (×9): qty 1

## 2022-01-27 MED ORDER — LEVALBUTEROL HCL 1.25 MG/0.5ML IN NEBU
1.2500 mg | INHALATION_SOLUTION | Freq: Three times a day (TID) | RESPIRATORY_TRACT | Status: DC
  Administered 2022-01-27 – 2022-01-29 (×9): 1.25 mg via RESPIRATORY_TRACT
  Filled 2022-01-27 (×10): qty 0.5

## 2022-01-27 MED ORDER — INSULIN ASPART 100 UNIT/ML IJ SOLN
0.0000 [IU] | Freq: Every day | INTRAMUSCULAR | Status: DC
  Administered 2022-01-27 – 2022-01-28 (×2): 2 [IU] via SUBCUTANEOUS
  Administered 2022-01-29: 3 [IU] via SUBCUTANEOUS
  Administered 2022-01-30: 4 [IU] via SUBCUTANEOUS
  Administered 2022-02-01: 3 [IU] via SUBCUTANEOUS
  Filled 2022-01-27 (×5): qty 1

## 2022-01-27 MED ORDER — METHYLPREDNISOLONE SODIUM SUCC 40 MG IJ SOLR
20.0000 mg | INTRAMUSCULAR | Status: DC
  Administered 2022-01-27: 20 mg via INTRAVENOUS
  Filled 2022-01-27: qty 1

## 2022-01-27 MED ORDER — INSULIN GLARGINE-YFGN 100 UNIT/ML ~~LOC~~ SOLN
30.0000 [IU] | Freq: Every day | SUBCUTANEOUS | Status: DC
  Administered 2022-01-27 – 2022-01-29 (×3): 30 [IU] via SUBCUTANEOUS
  Filled 2022-01-27 (×4): qty 0.3

## 2022-01-27 MED ORDER — INSULIN ASPART 100 UNIT/ML IJ SOLN
0.0000 [IU] | Freq: Three times a day (TID) | INTRAMUSCULAR | Status: DC
  Administered 2022-01-27: 2 [IU] via SUBCUTANEOUS
  Administered 2022-01-28 – 2022-01-29 (×3): 3 [IU] via SUBCUTANEOUS
  Administered 2022-01-29 – 2022-01-30 (×4): 2 [IU] via SUBCUTANEOUS
  Administered 2022-01-31: 3 [IU] via SUBCUTANEOUS
  Administered 2022-01-31 (×2): 2 [IU] via SUBCUTANEOUS
  Administered 2022-02-01: 3 [IU] via SUBCUTANEOUS
  Administered 2022-02-01: 5 [IU] via SUBCUTANEOUS
  Administered 2022-02-01: 3 [IU] via SUBCUTANEOUS
  Administered 2022-02-02: 5 [IU] via SUBCUTANEOUS
  Filled 2022-01-27 (×15): qty 1

## 2022-01-27 MED ORDER — INSULIN ASPART 100 UNIT/ML IJ SOLN
0.0000 [IU] | Freq: Three times a day (TID) | INTRAMUSCULAR | Status: DC
  Administered 2022-01-27: 4 [IU] via SUBCUTANEOUS
  Administered 2022-01-27: 15 [IU] via SUBCUTANEOUS
  Administered 2022-01-27: 7 [IU] via SUBCUTANEOUS
  Filled 2022-01-27 (×3): qty 1

## 2022-01-27 NOTE — Assessment & Plan Note (Addendum)
Pulse ox 88% on room air initially.  Yesterday with ambulation pulse ox 81%.  Continue oxygen supplementation and try to taper off as bronchospasm improves.  Will get CT scan of the chest negative for pulmonary embolism.  Try to taper off oxygen every day.

## 2022-01-27 NOTE — ED Notes (Signed)
Pt given ice water per her request. 

## 2022-01-27 NOTE — ED Notes (Signed)
Per pt, post Haldol admin, restlessness and anxiety intensified. IP NP Jon Billings notified and ordered 2mg  Ativan.

## 2022-01-27 NOTE — Progress Notes (Signed)
Inpatient Diabetes Program Recommendations  AACE/ADA: New Consensus Statement on Inpatient Glycemic Control (2015)  Target Ranges:  Prepandial:   less than 140 mg/dL      Peak postprandial:   less than 180 mg/dL (1-2 hours)      Critically ill patients:  140 - 180 mg/dL   Lab Results  Component Value Date   GLUCAP 217 (H) 01/27/2022    Review of Glycemic Control  Latest Reference Range & Units 01/26/22 20:05 01/27/22 00:31 01/27/22 08:32  Glucose-Capillary 70 - 99 mg/dL 711 (H) 657 (H) 903 (H)   Diabetes history: DM 2 Outpatient Diabetes medications:  U500 10 units tid with meals, Toujeo 30 units q HS Current orders for Inpatient glycemic control:  Novolog 0-20 units tid with meals and HS Semglee 15 units q HS Solumedrol 20 mg IV q 24 hours  Inpatient Diabetes Program Recommendations:    Consider increasing Semglee to 30 units q HS (this is home dose).     Thanks,  Beryl Meager, RN, BC-ADM Inpatient Diabetes Coordinator Pager [phone]  (8a-5p)

## 2022-01-27 NOTE — Progress Notes (Signed)
  Progress Note   Patient: Elaine [name] ION:[id number] DOB: [birth year] DOA: 01/26/2022     0 DOS: the patient was seen and examined on 01/27/2022     Assessment and Plan: * Acute respiratory failure with hypoxia (HCC) Pulse ox 88% on room air.  Continue oxygen supplementation and try to taper off as bronchospasm improves.  Asthma exacerbation Rhinovirus infection likely the trigger.  Supportive care.  Decrease dose of Solu-Medrol down to 20 mg daily.  Continue budesonide and Xopenex nebulizers.  Too much bronchospasm to go home today.  Uncontrolled type 2 diabetes mellitus with hyperglycemia (HCC) Hemoglobin A1c 9.1 as outpatient on 12/22/2021.  Increase Semglee insulin to 30 units and short acting insulin prior to meals plus sliding scale.  Chronic pain syndrome - Continue home scheduled morphine, oxycodone and Lyrica with as needed tizanidine  Tobacco abuse - Nicotine patch ordered as requested by patient  Obesity, Class III, BMI 40-49.9 (morbid obesity) (HCC) BMI 42.68 with current height and weight in computer.        Subjective: Patient states that the steroids are making her itching and little confused.  Patient received 125 mg of Solu-Medrol yesterday.  Still complains of shortness of breath cough and wheeze.  Positive for rhinovirus.  Physical Exam: Vitals:   01/27/22 0546 01/27/22 1030 01/27/22 1100 01/27/22 1200  BP: (!) 156/87 (!) 151/94  (!) 148/90  Pulse: (!) 106 (!) 101 (!) 102 (!) 108  Resp: (!) 22 (!) 26 (!) 27 (!) 26  Temp: 98.4 F (36.9 C)   98.1 F (36.7 C)  TempSrc: Oral   Oral  SpO2: 92% 93% 95% 93%  Weight:      Height:       Physical Exam HENT:     Head: Normocephalic.     Mouth/Throat:     Pharynx: No oropharyngeal exudate.  Eyes:     General: Lids are normal.     Conjunctiva/sclera: Conjunctivae normal.  Cardiovascular:     Rate and Rhythm: Normal rate and regular rhythm.     Heart sounds: Normal heart sounds, S1 normal and S2 normal.   Pulmonary:     Breath sounds: Examination of the right-middle field reveals wheezing. Examination of the left-middle field reveals wheezing. Examination of the right-lower field reveals decreased breath sounds and wheezing. Examination of the left-lower field reveals decreased breath sounds and wheezing. Decreased breath sounds and wheezing present. No rhonchi or rales.  Abdominal:     Palpations: Abdomen is soft.     Tenderness: There is no abdominal tenderness.  Musculoskeletal:     Right lower leg: Swelling present.     Left lower leg: Swelling present.  Skin:    General: Skin is warm.     Findings: No rash.  Neurological:     Mental Status: She is alert and oriented to person, place, and time.     Data Reviewed: Creatinine 0.63, white blood cell count 24.2, hemoglobin 14.6, platelet count 355, viral respiratory panel positive for rhinovirus  Family Communication: Declined  Disposition: Status is: Observation Patient with too much bronchospasm in order to go home today.  Continue IV Solu-Medrol and nebulizer treatments.  Planned Discharge Destination: Home    Time spent: 28 minutes  Author: Alford Highland, MD 01/27/2022 12:37 PM  For on call review [email or web address].

## 2022-01-27 NOTE — Progress Notes (Signed)
       CROSS COVER NOTE  NAME: Elaine [name] MRN: [id number] DOB : 1980/08/16    Date of Service   01/26/2022  HPI/Events of Note   Nurse reports patient reports feeling of panic despite additional haldol ordered earlier by admitting MD. Patient states she has had panic attacks in the past. Told nurse she feels like eeverything inside of her is coming out  Assessment and  Interventions   Assessment:  Plan: Ativan 2 mg x1 IV      Donnie Mesa NP [facility]

## 2022-01-27 NOTE — ED Notes (Signed)
Family remains at bedside.

## 2022-01-27 NOTE — Assessment & Plan Note (Signed)
BMI 42.68 with current height and weight in computer.

## 2022-01-28 DIAGNOSIS — J4541 Moderate persistent asthma with (acute) exacerbation: Secondary | ICD-10-CM | POA: Diagnosis not present

## 2022-01-28 DIAGNOSIS — E1165 Type 2 diabetes mellitus with hyperglycemia: Secondary | ICD-10-CM | POA: Diagnosis not present

## 2022-01-28 DIAGNOSIS — G894 Chronic pain syndrome: Secondary | ICD-10-CM | POA: Diagnosis not present

## 2022-01-28 DIAGNOSIS — J9601 Acute respiratory failure with hypoxia: Secondary | ICD-10-CM | POA: Diagnosis not present

## 2022-01-28 LAB — GLUCOSE, CAPILLARY
Glucose-Capillary: 108 mg/dL — ABNORMAL HIGH (ref 70–99)
Glucose-Capillary: 157 mg/dL — ABNORMAL HIGH (ref 70–99)
Glucose-Capillary: 174 mg/dL — ABNORMAL HIGH (ref 70–99)
Glucose-Capillary: 242 mg/dL — ABNORMAL HIGH (ref 70–99)

## 2022-01-28 LAB — HIV ANTIBODY (ROUTINE TESTING W REFLEX): HIV Screen 4th Generation wRfx: NONREACTIVE

## 2022-01-28 MED ORDER — STERILE WATER FOR INJECTION IJ SOLN
INTRAMUSCULAR | Status: AC
  Filled 2022-01-28: qty 10

## 2022-01-28 MED ORDER — DIPHENHYDRAMINE HCL 25 MG PO CAPS
25.0000 mg | ORAL_CAPSULE | Freq: Four times a day (QID) | ORAL | Status: DC | PRN
  Administered 2022-01-28: 25 mg via ORAL
  Filled 2022-01-28: qty 1

## 2022-01-28 MED ORDER — DIAZEPAM 5 MG/ML IJ SOLN
2.5000 mg | Freq: Once | INTRAMUSCULAR | Status: AC
  Administered 2022-01-28: 2.5 mg via INTRAVENOUS
  Filled 2022-01-28: qty 2

## 2022-01-28 MED ORDER — ORAL CARE MOUTH RINSE: 15.0000 mL | OROMUCOSAL | Status: DC | PRN

## 2022-01-28 MED ORDER — METHYLPREDNISOLONE SODIUM SUCC 40 MG IJ SOLR
40.0000 mg | INTRAMUSCULAR | Status: DC
  Administered 2022-01-28 – 2022-01-31 (×4): 40 mg via INTRAVENOUS
  Filled 2022-01-28 (×5): qty 1

## 2022-01-28 NOTE — Progress Notes (Signed)
  Progress Note   Patient: Elaine [name]:[id number] DOB: 1980/12/29 DOA: 01/26/2022     1 DOS: the patient was seen and examined on 01/28/2022     Assessment and Plan: * Acute respiratory failure with hypoxia (HCC) Pulse ox 88% on room air initially.  Today with ambulation pulse ox 87%.  Continue oxygen supplementation and try to taper off as bronchospasm improves.  Asthma exacerbation Rhinovirus infection likely the trigger.  Supportive care.  Increase Solu-Medrol  to 40 mg daily.  Continue budesonide and Xopenex nebulizers.  Too much bronchospasm to go home today.  Uncontrolled type 2 diabetes mellitus with hyperglycemia (HCC) Hemoglobin A1c 9.1 as outpatient on 12/22/2021.  Increase Semglee insulin to 30 units and short acting insulin prior to meals plus sliding scale.  Chronic pain syndrome - Continue home scheduled morphine, oxycodone and Lyrica with as needed tizanidine  Tobacco abuse - Nicotine patch ordered as requested by patient  Obesity, Class III, BMI 40-49.9 (morbid obesity) (HCC) BMI 42.68 with current height and weight in computer.        Subjective: Patient still feels tight.  Okay with going up on the Solu-Medrol dose even though she feels anxious with the steroids.  Still coughing and wheezing.  Physical Exam: Vitals:   01/28/22 0819 01/28/22 0821 01/28/22 0910 01/28/22 0913  BP:      Pulse:      Resp:      Temp:      TempSrc:      SpO2: (!) 88% 96% (!) 87% 95%  Weight:      Height:       Physical Exam HENT:     Head: Normocephalic.     Mouth/Throat:     Pharynx: No oropharyngeal exudate.  Eyes:     General: Lids are normal.     Conjunctiva/sclera: Conjunctivae normal.  Cardiovascular:     Rate and Rhythm: Normal rate and regular rhythm.     Heart sounds: Normal heart sounds, S1 normal and S2 normal.  Pulmonary:     Breath sounds: Examination of the right-middle field reveals wheezing. Examination of the left-middle field reveals wheezing.  Examination of the right-lower field reveals decreased breath sounds and wheezing. Examination of the left-lower field reveals decreased breath sounds and wheezing. Decreased breath sounds and wheezing present. No rhonchi or rales.  Abdominal:     Palpations: Abdomen is soft.     Tenderness: There is no abdominal tenderness.  Musculoskeletal:     Right lower leg: Swelling present.     Left lower leg: Swelling present.  Skin:    General: Skin is warm.     Findings: No rash.  Neurological:     Mental Status: She is alert and oriented to person, place, and time.     Data Reviewed: Last four sugars 144, 249, 108, 157  Family Communication: Declined  Disposition: Status is: Inpatient Remains inpatient appropriate because: Still with bronchospasm today.  Continue IV Solu-Medrol  Planned Discharge Destination: Home    Time spent: 28 minutes  Author: Alford Highland, MD 01/28/2022 1:23 PM  For on call review [email or web address].

## 2022-01-28 NOTE — Progress Notes (Signed)
Patient is asking for something else to calm her. Patient says she feels "very anxious, jittery". Not time for ativan yet. Dr Renae Gloss notified. Order placed for valium

## 2022-01-29 DIAGNOSIS — J4521 Mild intermittent asthma with (acute) exacerbation: Secondary | ICD-10-CM | POA: Diagnosis not present

## 2022-01-29 DIAGNOSIS — J9601 Acute respiratory failure with hypoxia: Secondary | ICD-10-CM | POA: Diagnosis not present

## 2022-01-29 DIAGNOSIS — B348 Other viral infections of unspecified site: Secondary | ICD-10-CM | POA: Diagnosis not present

## 2022-01-29 DIAGNOSIS — E1165 Type 2 diabetes mellitus with hyperglycemia: Secondary | ICD-10-CM | POA: Diagnosis not present

## 2022-01-29 LAB — BASIC METABOLIC PANEL
Anion gap: 9 (ref 5–15)
BUN: 15 mg/dL (ref 6–20)
CO2: 31 mmol/L (ref 22–32)
Calcium: 9.2 mg/dL (ref 8.9–10.3)
Chloride: 99 mmol/L (ref 98–111)
Creatinine, Ser: 0.68 mg/dL (ref 0.44–1.00)
GFR, Estimated: >60 mL/min (ref >60)
Glucose, Bld: 124 mg/dL — ABNORMAL HIGH (ref 70–99)
Potassium: 3.8 mmol/L (ref 3.5–5.1)
Sodium: 139 mmol/L (ref 135–145)

## 2022-01-29 LAB — CBC
HCT: 45.2 % (ref 36.0–46.0)
Hemoglobin: 14 g/dL (ref 12.0–15.0)
MCH: 24.6 pg — ABNORMAL LOW (ref 26.0–34.0)
MCHC: 31 g/dL (ref 30.0–36.0)
MCV: 79.6 fL — ABNORMAL LOW (ref 80.0–100.0)
Platelets: 356 10*3/uL (ref 150–400)
RBC: 5.68 MIL/uL — ABNORMAL HIGH (ref 3.87–5.11)
RDW: 14.7 % (ref 11.5–15.5)
WBC: 23.3 10*3/uL — ABNORMAL HIGH (ref 4.0–10.5)
nRBC: 0 % (ref 0.0–0.2)

## 2022-01-29 LAB — GLUCOSE, CAPILLARY
Glucose-Capillary: 131 mg/dL — ABNORMAL HIGH (ref 70–99)
Glucose-Capillary: 130 mg/dL — ABNORMAL HIGH (ref 70–99)
Glucose-Capillary: 181 mg/dL — ABNORMAL HIGH (ref 70–99)
Glucose-Capillary: 266 mg/dL — ABNORMAL HIGH (ref 70–99)

## 2022-01-29 MED ORDER — LEVALBUTEROL HCL 1.25 MG/0.5ML IN NEBU
1.2500 mg | INHALATION_SOLUTION | Freq: Three times a day (TID) | RESPIRATORY_TRACT | Status: DC
  Administered 2022-01-30 – 2022-02-02 (×10): 1.25 mg via RESPIRATORY_TRACT
  Filled 2022-01-29 (×12): qty 0.5

## 2022-01-29 MED ORDER — STERILE WATER FOR INJECTION IJ SOLN
INTRAMUSCULAR | Status: AC
  Administered 2022-01-29: 10 mL
  Filled 2022-01-29: qty 10

## 2022-01-29 MED ORDER — DIAZEPAM 5 MG/ML IJ SOLN
2.5000 mg | Freq: Once | INTRAMUSCULAR | Status: AC
  Administered 2022-01-29: 2.5 mg via INTRAVENOUS
  Filled 2022-01-29: qty 2

## 2022-01-29 NOTE — Progress Notes (Signed)
Patient ambulated around the nurses station once. She had to stop and sit to rest half way around. Right before getting to the room she felt dizzy and about passed out. Her oxygen stayed between 88-90% on room air. Patient said she noticed it read 81% right before she almost passed out but I was watching her and did not see that drop. Patient assisted back to bed. Patient back on 2L oxygen to maintain sats of 96%

## 2022-01-29 NOTE — Progress Notes (Signed)
  Progress Note   Patient: Elaine [name] [id number]:[id number] DOB: [birth year] DOA: 01/26/2022     2 DOS: the patient was seen and examined on 01/29/2022     Assessment and Plan: * Acute respiratory failure with hypoxia (HCC) Pulse ox 88% on room air initially.  Today with ambulation pulse ox 88%.  Continue oxygen supplementation and try to taper off as bronchospasm improves.  Asthma exacerbation Rhinovirus infection likely the trigger.  Supportive care.  Continue Solu-Medrol  to 40 mg daily.  Continue budesonide and Xopenex nebulizers.  Too much bronchospasm to go home today.  Uncontrolled type 2 diabetes mellitus with hyperglycemia (HCC) Hemoglobin A1c 9.1 as outpatient on 12/22/2021.  Contniue Semglee insulin to 30 units and short acting insulin prior to meals plus sliding scale.  Chronic pain syndrome - Continue home scheduled morphine, oxycodone and Lyrica with as needed tizanidine  Tobacco abuse - Nicotine patch ordered as requested by patient  Obesity, Class III, BMI 40-49.9 (morbid obesity) (HCC) BMI 42.68 with current height and weight in computer.        Subjective: Patient states that she has been better.  Still wheezing and coughing and short of breath.  States that she recently had Augmentin and had an allergic reaction.  Physical Exam: Vitals:   01/29/22 0750 01/29/22 0804 01/29/22 0811 01/29/22 1407  BP:  (!) 157/99    Pulse:  87    Resp:  16    Temp:  97.7 F (36.5 C)    TempSrc:      SpO2: 96% 96% 95% 95%  Weight:      Height:       Physical Exam HENT:     Head: Normocephalic.     Mouth/Throat:     Pharynx: No oropharyngeal exudate.  Eyes:     General: Lids are normal.     Conjunctiva/sclera: Conjunctivae normal.  Cardiovascular:     Rate and Rhythm: Normal rate and regular rhythm.     Heart sounds: Normal heart sounds, S1 normal and S2 normal.  Pulmonary:     Breath sounds: Transmitted upper airway sounds present. Examination of the right-lower  field reveals decreased breath sounds and wheezing. Examination of the left-lower field reveals decreased breath sounds and wheezing. Decreased breath sounds and wheezing present. No rhonchi or rales.  Abdominal:     Palpations: Abdomen is soft.     Tenderness: There is no abdominal tenderness.  Musculoskeletal:     Right lower leg: Swelling present.     Left lower leg: Swelling present.  Skin:    General: Skin is warm.     Findings: No rash.  Neurological:     Mental Status: She is alert and oriented to person, place, and time.     Data Reviewed: Creatinine 0.68, white blood cell count 23.3, hemoglobin 14.4, count 356  Family Communication: Declined  Disposition: Status is: Inpatient Remains inpatient appropriate because: Still wheezing.  Continue IV Solu-Medrol.  Planned Discharge Destination: Home    Time spent: 27 minutes  Author: Alford Highland, MD 01/29/2022 2:45 PM  For on call review [email or web address].

## 2022-01-30 ENCOUNTER — Inpatient Hospital Stay: Payer: BC Managed Care – PPO

## 2022-01-30 DIAGNOSIS — J9601 Acute respiratory failure with hypoxia: Secondary | ICD-10-CM | POA: Diagnosis not present

## 2022-01-30 DIAGNOSIS — G894 Chronic pain syndrome: Secondary | ICD-10-CM | POA: Diagnosis not present

## 2022-01-30 DIAGNOSIS — E1165 Type 2 diabetes mellitus with hyperglycemia: Secondary | ICD-10-CM | POA: Diagnosis not present

## 2022-01-30 DIAGNOSIS — D72829 Elevated white blood cell count, unspecified: Secondary | ICD-10-CM

## 2022-01-30 DIAGNOSIS — J4541 Moderate persistent asthma with (acute) exacerbation: Secondary | ICD-10-CM | POA: Diagnosis not present

## 2022-01-30 LAB — GLUCOSE, CAPILLARY
Glucose-Capillary: 150 mg/dL — ABNORMAL HIGH (ref 70–99)
Glucose-Capillary: 139 mg/dL — ABNORMAL HIGH (ref 70–99)
Glucose-Capillary: 88 mg/dL (ref 70–99)
Glucose-Capillary: 311 mg/dL — ABNORMAL HIGH (ref 70–99)

## 2022-01-30 MED ORDER — IOHEXOL 350 MG/ML SOLN
75.0000 mL | Freq: Once | INTRAVENOUS | Status: AC | PRN
  Administered 2022-01-30: 75 mL via INTRAVENOUS

## 2022-01-30 MED ORDER — INSULIN ASPART 100 UNIT/ML IJ SOLN
4.0000 [IU] | Freq: Three times a day (TID) | INTRAMUSCULAR | Status: DC
  Administered 2022-01-31 – 2022-02-02 (×6): 4 [IU] via SUBCUTANEOUS
  Filled 2022-01-30 (×7): qty 1

## 2022-01-30 MED ORDER — TRAZODONE HCL 100 MG PO TABS
100.0000 mg | ORAL_TABLET | Freq: Every day | ORAL | Status: DC
  Administered 2022-01-30 – 2022-02-01 (×3): 100 mg via ORAL
  Filled 2022-01-30 (×3): qty 1

## 2022-01-30 MED ORDER — INSULIN GLARGINE-YFGN 100 UNIT/ML ~~LOC~~ SOLN
18.0000 [IU] | Freq: Every day | SUBCUTANEOUS | Status: DC
  Administered 2022-01-30: 18 [IU] via SUBCUTANEOUS
  Filled 2022-01-30 (×2): qty 0.18

## 2022-01-30 NOTE — Progress Notes (Signed)
Progress Note   Patient: Elaine [name] [id number]:[id number] DOB: 1980/05/01 DOA: 01/26/2022     3 DOS: the patient was seen and examined on 01/30/2022   Brief hospital course: 41 year old female with past medical history of anxiety, chronic pain, type 2 diabetes mellitus and asthma presented with shortness of breath he was found to have asthma exacerbation and was positive for rhinovirus.  Steroids were given along with steroid nebulizers.  Patient has been slow to progress.  Pulse ox while ambulating on 01/30/2022 on room air dropped to 81%.  Assessment and Plan: * Acute respiratory failure with hypoxia (HCC) Pulse ox 88% on room air initially.  Today with ambulation pulse ox 81%.  Continue oxygen supplementation and try to taper off as bronchospasm improves.  Will get CT scan of the chest to rule out pulmonary embolism and look at lung parenchyma.  Asthma exacerbation Rhinovirus infection likely the trigger.  Supportive care.  Continue Solu-Medrol  to 40 mg daily.  Continue budesonide and Xopenex nebulizers.  More hypoxic with ambulation today.  Uncontrolled type 2 diabetes mellitus with hyperglycemia (HCC) Hemoglobin A1c 9.1 as outpatient on 12/22/2021.  Contniue Semglee insulin to 30 units and short acting insulin prior to meals plus sliding scale.  Chronic pain syndrome - Continue home scheduled morphine, oxycodone and Lyrica with as needed tizanidine  Tobacco abuse - Nicotine patch ordered as requested by patient  Leukocytosis Looking back at labs she has always had an elevated white count even in the past.  Even in outpatient labs that I can see in the care everywhere.  May end up needing referral to hematology as outpatient.  Obesity, Class III, BMI 40-49.9 (morbid obesity) (HCC) BMI 42.68 with current height and weight in computer.        Subjective: Patient still with shortness of breath and wheeze and cough.  Had a rough night with regards to her breathing.  Admitted with  acute respiratory failure and rhinovirus infection.  Physical Exam: Vitals:   01/29/22 1950 01/30/22 0404 01/30/22 0806 01/30/22 0831  BP: (!) 167/88 (!) 149/89  (!) 173/101  Pulse: (!) 110 88  94  Resp: 20 16  16   Temp: 97.8 F (36.6 C) 98.1 F (36.7 C)  (!) 97.5 F (36.4 C)  TempSrc: Oral Oral  Oral  SpO2: 92% 97% 95% 96%  Weight:      Height:       Physical Exam HENT:     Head: Normocephalic.     Mouth/Throat:     Pharynx: No oropharyngeal exudate.  Eyes:     General: Lids are normal.     Conjunctiva/sclera: Conjunctivae normal.  Cardiovascular:     Rate and Rhythm: Normal rate and regular rhythm.     Heart sounds: Normal heart sounds, S1 normal and S2 normal.  Pulmonary:     Breath sounds: Transmitted upper airway sounds present. Examination of the right-lower field reveals decreased breath sounds and wheezing. Examination of the left-lower field reveals decreased breath sounds and wheezing. Decreased breath sounds and wheezing present. No rhonchi or rales.  Abdominal:     Palpations: Abdomen is soft.     Tenderness: There is no abdominal tenderness.  Musculoskeletal:     Right lower leg: Swelling present.     Left lower leg: Swelling present.  Skin:    General: Skin is warm.     Findings: No rash.  Neurological:     Mental Status: She is alert and oriented to person, place, and time.  Data Reviewed: Pulse ox while ambulating dropped to 81% on room air.   Disposition: Status is: Inpatient Remains inpatient appropriate because: We will get a CT scan of the chest.  Persistent hypoxia and worse with ambulation.  Planned Discharge Destination: Home    Time spent: 27 minutes  Author: Alford Highland, MD 01/30/2022 1:36 PM  For on call review [email or web address].

## 2022-01-30 NOTE — Plan of Care (Signed)

## 2022-01-30 NOTE — Assessment & Plan Note (Signed)
Persistent leukocytosis looking through care everywhere and prior labs in epic.  Refer to hematology as outpatient.

## 2022-01-30 NOTE — Hospital Course (Signed)
41 year old female with past medical history of anxiety, chronic pain, type 2 diabetes mellitus and asthma presented with shortness of breath he was found to have asthma exacerbation and was positive for rhinovirus.  Steroids were given along with steroid nebulizers.  Patient has been slow to progress.  Pulse ox while ambulating on 01/30/2022 on room air dropped to 81%.

## 2022-01-30 NOTE — Progress Notes (Signed)
SATURATION QUALIFICATIONS: (This note is used to comply with regulatory documentation for home oxygen)  Patient Saturations on Room Air at Rest = 86%  Patient Saturations on Room Air while Ambulating = 81%  Patient Saturations on 3 Liters of oxygen while Ambulating = 92%

## 2022-01-31 ENCOUNTER — Inpatient Hospital Stay (HOSPITAL_COMMUNITY)
Admit: 2022-01-31 | Discharge: 2022-01-31 | Disposition: A | Discharge status: Home / Self Care | Payer: BC Managed Care – PPO | Attending: Internal Medicine | Admitting: Internal Medicine

## 2022-01-31 DIAGNOSIS — G894 Chronic pain syndrome: Secondary | ICD-10-CM | POA: Diagnosis not present

## 2022-01-31 DIAGNOSIS — R0609 Other forms of dyspnea: Secondary | ICD-10-CM

## 2022-01-31 DIAGNOSIS — E1165 Type 2 diabetes mellitus with hyperglycemia: Secondary | ICD-10-CM | POA: Diagnosis not present

## 2022-01-31 DIAGNOSIS — J4541 Moderate persistent asthma with (acute) exacerbation: Secondary | ICD-10-CM | POA: Diagnosis not present

## 2022-01-31 DIAGNOSIS — J9601 Acute respiratory failure with hypoxia: Secondary | ICD-10-CM | POA: Diagnosis not present

## 2022-01-31 DIAGNOSIS — B370 Candidal stomatitis: Secondary | ICD-10-CM

## 2022-01-31 LAB — ECHOCARDIOGRAM COMPLETE
Height: 67 in
S' Lateral: 3.2 cm
Weight: 4359.82 oz

## 2022-01-31 LAB — GLUCOSE, CAPILLARY
Glucose-Capillary: 132 mg/dL — ABNORMAL HIGH (ref 70–99)
Glucose-Capillary: 162 mg/dL — ABNORMAL HIGH (ref 70–99)
Glucose-Capillary: 126 mg/dL — ABNORMAL HIGH (ref 70–99)
Glucose-Capillary: 170 mg/dL — ABNORMAL HIGH (ref 70–99)

## 2022-01-31 MED ORDER — INSULIN GLARGINE-YFGN 100 UNIT/ML ~~LOC~~ SOLN
22.0000 [IU] | Freq: Every day | SUBCUTANEOUS | Status: DC
  Administered 2022-01-31 – 2022-02-01 (×2): 22 [IU] via SUBCUTANEOUS
  Filled 2022-01-31 (×3): qty 0.22

## 2022-01-31 MED ORDER — FLUCONAZOLE 100 MG PO TABS
100.0000 mg | ORAL_TABLET | Freq: Every day | ORAL | Status: DC
  Administered 2022-02-01 – 2022-02-02 (×2): 100 mg via ORAL
  Filled 2022-01-31 (×2): qty 1

## 2022-01-31 MED ORDER — POLYETHYLENE GLYCOL 3350 17 G PO PACK: 17.0000 g | PACK | Freq: Every day | ORAL | Status: DC

## 2022-01-31 MED ORDER — FLUCONAZOLE 100 MG PO TABS
200.0000 mg | ORAL_TABLET | Freq: Once | ORAL | Status: AC
  Administered 2022-01-31: 200 mg via ORAL
  Filled 2022-01-31: qty 2

## 2022-01-31 MED ORDER — KETOROLAC TROMETHAMINE 15 MG/ML IJ SOLN
15.0000 mg | Freq: Once | INTRAMUSCULAR | Status: AC
  Administered 2022-01-31: 15 mg via INTRAVENOUS
  Filled 2022-01-31: qty 1

## 2022-01-31 MED ORDER — MAGNESIUM SULFATE 2 GM/50ML IV SOLN
2.0000 g | Freq: Once | INTRAVENOUS | Status: AC
  Administered 2022-01-31: 2 g via INTRAVENOUS
  Filled 2022-01-31: qty 50

## 2022-01-31 MED ORDER — POLYETHYLENE GLYCOL 3350 17 G PO PACK
17.0000 g | PACK | Freq: Two times a day (BID) | ORAL | Status: DC
  Administered 2022-01-31: 17 g via ORAL
  Filled 2022-01-31 (×3): qty 1

## 2022-01-31 MED ORDER — LACTULOSE 10 GM/15ML PO SOLN: 30.0000 g | Freq: Every day | ORAL | Status: DC | PRN

## 2022-01-31 MED ORDER — SODIUM CHLORIDE 0.9 % IV SOLN
12.5000 mg | Freq: Once | INTRAVENOUS | Status: DC
  Filled 2022-01-31: qty 0.5

## 2022-01-31 NOTE — Progress Notes (Signed)
Progress Note   Patient: Elaine [name] KZS:[id number] DOB: 07-11-1980 DOA: 01/26/2022     4 DOS: the patient was seen and examined on 01/31/2022   Brief hospital course: 41 year old female with past medical history of anxiety, chronic pain, type 2 diabetes mellitus and asthma presented with shortness of breath he was found to have asthma exacerbation and was positive for rhinovirus.  Steroids were given along with steroid nebulizers.  Patient has been slow to progress.  Pulse ox while ambulating on 01/30/2022 on room air dropped to 81%.  Assessment and Plan: * Acute respiratory failure with hypoxia (HCC) Pulse ox 88% on room air initially.  Yesterday with ambulation pulse ox 81%.  Continue oxygen supplementation and try to taper off as bronchospasm improves.  Will get CT scan of the chest negative for pulmonary embolism.  Try to taper off oxygen every day.  Asthma exacerbation Rhinovirus infection likely the trigger.  Supportive care.  Continue Solu-Medrol  to 40 mg daily.  Continue budesonide and Xopenex nebulizers.  More hypoxic with ambulation yesterday.  Uncontrolled type 2 diabetes mellitus with hyperglycemia (HCC) Hemoglobin A1c 9.1 as outpatient on 12/22/2021.  With yesterday's sugar being on the lower side but I did cut back on the long-acting insulin last night.  Will increase back to 22 units in the evening.  Chronic pain syndrome - Continue home scheduled morphine, oxycodone and Lyrica with as needed tizanidine  Tobacco abuse - Nicotine patch ordered as requested by patient  Thrush Will give Diflucan 200 mg once and 100 mg daily after that  Leukocytosis Looking back at labs she has always had an elevated white count even in the past.  Even in outpatient labs that I can see in the care everywhere.  May end up needing referral to hematology as outpatient.  Obesity, Class III, BMI 40-49.9 (morbid obesity) (HCC) BMI 42.68 with current height and weight in computer.         Subjective: Patient feels a little bit better today than yesterday.  Still having some shortness of breath and cough especially with taking a deep breath.  Admitted with asthma exacerbation and rhinovirus infection  Physical Exam: Vitals:   01/30/22 2001 01/31/22 0405 01/31/22 0806 01/31/22 0823  BP:  (!) 160/94 (!) 145/80   Pulse:  90 82   Resp:  20 18   Temp:  (!) 97.5 F (36.4 C) 98.2 F (36.8 C)   TempSrc:  Oral Oral   SpO2: 92% 96% 100% 97%  Weight:      Height:       Physical Exam HENT:     Head: Normocephalic.     Mouth/Throat:     Pharynx: No oropharyngeal exudate.  Eyes:     General: Lids are normal.     Conjunctiva/sclera: Conjunctivae normal.  Cardiovascular:     Rate and Rhythm: Normal rate and regular rhythm.     Heart sounds: Normal heart sounds, S1 normal and S2 normal.  Pulmonary:     Breath sounds: No transmitted upper airway sounds. Examination of the right-lower field reveals decreased breath sounds. Examination of the left-lower field reveals decreased breath sounds. Decreased breath sounds present. No wheezing, rhonchi or rales.  Abdominal:     Palpations: Abdomen is soft.     Tenderness: There is no abdominal tenderness.  Musculoskeletal:     Right lower leg: No swelling.     Left lower leg: No swelling.  Skin:    General: Skin is warm.  Findings: No rash.  Neurological:     Mental Status: She is alert and oriented to person, place, and time.     Data Reviewed: CT scan of the chest negative for PE Echocardiogram showed a normal EF mild left ventricular hypertrophy   Disposition: Status is: Inpatient Remains inpatient appropriate because: Still trying to get off oxygen.  Planned Discharge Destination: Home    Time spent: 27 minutes  Author: Loletha Grayer, MD 01/31/2022 1:16 PM  For on call review [email or web address].

## 2022-01-31 NOTE — Assessment & Plan Note (Signed)
Will give Diflucan 200 mg once and 100 mg daily after that

## 2022-01-31 NOTE — Progress Notes (Signed)
*  PRELIMINARY RESULTS* Echocardiogram 2D Echocardiogram has been performed.  Elaine [name] 01/31/2022, 11:03 AM

## 2022-01-31 NOTE — TOC Initial Note (Signed)
Transition of Care [facility]) - Initial/Assessment Note    Patient Details  Name: Elaine [name] MRN: [id number] Date of Birth: [birth year]  Transition of Care [facility]) CM/SW Contact:    Chapman Fitch, RN Phone Number: 01/31/2022, 2:13 PM  Clinical Narrative:                        Transition of Care [facility]) Screening Note   Patient Details  Name: Elaine CREMEANS Date of Birth: 1981/02/05   Transition of Care [facility]) CM/SW Contact:    Chapman Fitch, RN Phone Number: 01/31/2022, 2:13 PM    Transition of Care Department [facility]) has reviewed patient and no TOC needs have been identified at this time. We will continue to monitor patient advancement through interdisciplinary progression rounds. If new patient transition needs arise, please place a TOC consult.  Patient currently on acute o2 with plans to wean.  Please consult TOC if home O2 needed at discharge   Patient Goals and CMS Choice        Expected Discharge Plan and Services                                                Prior Living Arrangements/Services                       Activities of Daily Living Home Assistive Devices/Equipment: Other (Comment) (pulse ox) ADL Screening (condition at time of admission) Patient's cognitive ability adequate to safely complete daily activities?: Yes Is the patient deaf or have difficulty hearing?: No Does the patient have difficulty seeing, even when wearing glasses/contacts?: No Does the patient have difficulty concentrating, remembering, or making decisions?: No Patient able to express need for assistance with ADLs?: Yes Does the patient have difficulty dressing or bathing?: No Independently performs ADLs?: Yes (appropriate for developmental age) Does the patient have difficulty walking or climbing stairs?: No Weakness of Legs: None Weakness of Arms/Hands: None  Permission Sought/Granted                  Emotional Assessment               Admission diagnosis:  Wheezing [R06.2] Acute respiratory failure with hypoxia (HCC) [J96.01] Asthma exacerbation [J45.901] Patient Active Problem List   Diagnosis Date Noted   Thrush 01/31/2022   Leukocytosis 01/30/2022   Acute respiratory failure with hypoxia (HCC) 01/27/2022   Rhinovirus infection 01/27/2022   Asthma exacerbation 01/26/2022   Chronic pain syndrome 01/26/2022   Uncontrolled type 2 diabetes mellitus with hyperglycemia (HCC) 12/24/2021   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   Abscess of anal and rectal regions 08/20/2020   Obesity, Class III, BMI 40-49.9 (morbid obesity) (HCC) 07/09/2020   B12 deficiency 03/02/2020   Iron deficiency 03/02/2020   Vitamin D deficiency 03/02/2020   Spondylolisthesis of lumbar region 02/12/2020   Hepatic steatosis 01/09/2020   DDD (degenerative disc disease), 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/19/2019   Intestinal metaplasia of gastric mucosa 03/27/2019   Iron deficiency anemia 03/26/2019   Neuropathic pain 03/29/2018   Lumbar radiculopathy 03/02/2017   Abnormal uterine bleeding 10/09/2015   Migraine, unspecified, not intractable, without status migrainosus 06/22/2014   Frontal headache 06/21/2014   Syncope 06/21/2014   Tobacco abuse 06/21/2014   PCP:  [name]:   [facility] [address] [place], [place] - [address] [place] [address] Phone: [phone] Fax: [phone]  [facility] [place], [place] - [address] [place] [address] Phone: [phone] Fax: [phone]     Social Determinants of Health (SDOH) Interventions    Readmission Risk Interventions     No data to display

## 2022-02-01 LAB — GLUCOSE, CAPILLARY
Glucose-Capillary: 159 mg/dL — ABNORMAL HIGH (ref 70–99)
Glucose-Capillary: 155 mg/dL — ABNORMAL HIGH (ref 70–99)
Glucose-Capillary: 205 mg/dL — ABNORMAL HIGH (ref 70–99)
Glucose-Capillary: 293 mg/dL — ABNORMAL HIGH (ref 70–99)

## 2022-02-01 MED ORDER — PREDNISONE 20 MG PO TABS
40.0000 mg | ORAL_TABLET | Freq: Every day | ORAL | Status: DC
  Administered 2022-02-01 – 2022-02-02 (×2): 40 mg via ORAL
  Filled 2022-02-01 (×2): qty 2

## 2022-02-01 NOTE — Progress Notes (Signed)
Progress Note   Patient: Elaine [name] [id number] DOB: 1981/01/25 DOA: 01/26/2022     5 DOS: the patient was seen and examined on 02/01/2022   Brief hospital course: 41 year old female with past medical history of anxiety, chronic pain, type 2 diabetes mellitus and asthma presented with shortness of breath he was found to have asthma exacerbation and was positive for rhinovirus.  Steroids were given along with steroid nebulizers.  Limited on the dose of steroids secondary to side effects with high-dose.  Patient taking Ativan with the steroid.  Patient has been slow to progress.  Pulse ox while ambulating on 01/30/2022 on room air dropped to 81%.  Pulse ox on 02/01/22 dropped down in the 80s again with ambulation.  Assessment and Plan: * Acute respiratory failure with hypoxia (HCC) Pulse ox 88% on room air initially.  Today with ambulation dropped down into the 80s as per nursing staff.  Continue Solu-Medrol and nebulizer treatments.  Goal to try to get off oxygen.  CT scan of the chest, negative for pulmonary embolism on 01/30/2022.  Asthma exacerbation Rhinovirus infection likely the trigger.  Supportive care.  Continue Solu-Medrol  to 40 mg daily.  Continue budesonide and Xopenex nebulizers.  Still hypoxic with ambulation today  Uncontrolled type 2 diabetes mellitus with hyperglycemia (HCC) Hemoglobin A1c 9.1 as outpatient on 12/22/2021.  Continue long-acting insulin 22 units.  Chronic pain syndrome - Continue home scheduled morphine, oxycodone and Lyrica with as needed tizanidine  Tobacco abuse - Nicotine patch  Thrush Continue Diflucan.  Leukocytosis Persistent leukocytosis looking through care everywhere and prior labs in epic.  Refer to hematology as outpatient.  Obesity, Class III, BMI 40-49.9 (morbid obesity) (HCC) BMI 42.68 with current height and weight in computer.        Subjective: Was notified today that her saturations dropped down into the 80s with  ambulation.  Patient being treated for asthma exacerbation and rhinovirus.  Patient starting to feel better still having some coughing and some wheezing.  Slow to progress.  Physical Exam: Vitals:   01/31/22 2035 02/01/22 0421 02/01/22 0744 02/01/22 1153  BP:  (!) 159/85 (!) 144/91 136/80  Pulse:  94 84 (!) 101  Resp:  20 10 20   Temp:  97.8 F (36.6 C) 98 F (36.7 C) 98.4 F (36.9 C)  TempSrc:  Oral Oral Oral  SpO2: 97% 98%  90%  Weight:      Height:       Physical Exam HENT:     Head: Normocephalic.     Mouth/Throat:     Pharynx: No oropharyngeal exudate.  Eyes:     General: Lids are normal.     Conjunctiva/sclera: Conjunctivae normal.  Cardiovascular:     Rate and Rhythm: Normal rate and regular rhythm.     Heart sounds: Normal heart sounds, S1 normal and S2 normal.  Pulmonary:     Breath sounds: No transmitted upper airway sounds. Examination of the right-lower field reveals decreased breath sounds. Examination of the left-lower field reveals decreased breath sounds. Decreased breath sounds present. No wheezing, rhonchi or rales.  Abdominal:     Palpations: Abdomen is soft.     Tenderness: There is no abdominal tenderness.  Musculoskeletal:     Right lower leg: No swelling.     Left lower leg: No swelling.  Skin:    General: Skin is warm.     Findings: No rash.  Neurological:     Mental Status: She is alert and oriented to  person, place, and time.     Data Reviewed: Last four sugars: 126, 170, 159 and 155  Disposition: Status is: Inpatient Remains inpatient appropriate because: Patient dropped down into the 80s with ambulation today.  Planned Discharge Destination: Home    Time spent: 27 minutes  Author: Alford Highland, MD 02/01/2022 3:34 PM  For on call review [email or web address].

## 2022-02-02 LAB — CBC
HCT: 44.2 % (ref 36.0–46.0)
Hemoglobin: 13.7 g/dL (ref 12.0–15.0)
MCH: 24.8 pg — ABNORMAL LOW (ref 26.0–34.0)
MCHC: 31 g/dL (ref 30.0–36.0)
MCV: 79.9 fL — ABNORMAL LOW (ref 80.0–100.0)
Platelets: 343 10*3/uL (ref 150–400)
RBC: 5.53 MIL/uL — ABNORMAL HIGH (ref 3.87–5.11)
RDW: 14.6 % (ref 11.5–15.5)
WBC: 28.1 10*3/uL — ABNORMAL HIGH (ref 4.0–10.5)
nRBC: 0 % (ref 0.0–0.2)

## 2022-02-02 LAB — BASIC METABOLIC PANEL
Anion gap: 8 (ref 5–15)
BUN: 14 mg/dL (ref 6–20)
CO2: 31 mmol/L (ref 22–32)
Calcium: 9 mg/dL (ref 8.9–10.3)
Chloride: 99 mmol/L (ref 98–111)
Creatinine, Ser: 0.68 mg/dL (ref 0.44–1.00)
GFR, Estimated: >60 mL/min (ref >60)
Glucose, Bld: 301 mg/dL — ABNORMAL HIGH (ref 70–99)
Potassium: 4.3 mmol/L (ref 3.5–5.1)
Sodium: 138 mmol/L (ref 135–145)

## 2022-02-02 LAB — GLUCOSE, CAPILLARY
Glucose-Capillary: 249 mg/dL — ABNORMAL HIGH (ref 70–99)
Glucose-Capillary: 204 mg/dL — ABNORMAL HIGH (ref 70–99)

## 2022-02-02 MED ORDER — PREDNISONE 20 MG PO TABS: 40.0000 mg | ORAL_TABLET | Freq: Every day | ORAL | 0 refills | Status: AC

## 2022-02-02 MED ORDER — BENZONATATE 100 MG PO CAPS: 100.0000 mg | ORAL_CAPSULE | Freq: Three times a day (TID) | ORAL | 0 refills | Status: AC | PRN

## 2022-02-02 MED ORDER — LORAZEPAM 1 MG PO TABS: 1.0000 mg | ORAL_TABLET | Freq: Every day | ORAL | 0 refills | Status: AC

## 2022-02-02 NOTE — Plan of Care (Signed)

## 2022-02-02 NOTE — Inpatient Diabetes Management (Signed)
Inpatient Diabetes Program Recommendations  AACE/ADA: New Consensus Statement on Inpatient Glycemic Control (2015)  Target Ranges:  Prepandial:   less than 140 mg/dL      Peak postprandial:   less than 180 mg/dL (1-2 hours)      Critically ill patients:  140 - 180 mg/dL   Lab Results  Component Value Date   GLUCAP 249 (H) 02/02/2022    Review of Glycemic Control  Latest Reference Range & Units 02/01/22 08:58 02/01/22 12:24 02/01/22 16:54 02/01/22 21:38 02/02/22 08:07  Glucose-Capillary 70 - 99 mg/dL 212 (H) 248 (H) 250 (H) 293 (H) 249 (H)  (H): Data is abnormally high  Diabetes history: DM 2 Outpatient Diabetes medications:  U500 10 units tid with meals, Toujeo 30 units q HS Current orders for Inpatient glycemic control:  Semglee 22 units BID, Novolog 0-15 units TID and 0-5 QHS, Novolog 4 units TID with meals, Prednisone 40 mg QD  Inpatient Diabetes Program Recommendations:    If prednisone continues, please consider:  Semglee 30 units BID  Will continue to follow while inpatient.  Thank you, Dulce Sellar, MSN, CDCES Diabetes Coordinator Inpatient Diabetes Program [phone] (team pager from 8a-5p)

## 2022-02-02 NOTE — Discharge Summary (Signed)
Physician Discharge Summary  Elaine [name]:[id number] DOB: 04-May-1980 DOA: 01/26/2022  PCP: [name]  Admit date: 01/26/2022 Discharge date: 02/02/2022  Admitted From: Home Disposition:  Home  Recommendations for Outpatient Follow-up:  Follow up with PCP in 1-2 weeks   Home Health:No Equipment/Devices:None   Discharge Condition:Stable  CODE STATUS:FULL  Diet recommendation: carb mod  Brief/Interim Summary: 41 year old female with past medical history of anxiety, chronic pain, type 2 diabetes mellitus and asthma presented with shortness of breath he was found to have asthma exacerbation and was positive for rhinovirus. Steroids were given along with steroid nebulizers. Limited on the dose of steroids secondary to side effects with high-dose. Patient taking Ativan with the steroid. Patient has been slow to progress. Pulse ox while ambulating on 01/30/2022 on room air dropped to 81%. Pulse ox on 02/01/22 dropped down in the 80s again with ambulation.   Patient on room air at time of discharge.  Ambulated with mobility specialist.  Patient did endorse some shortness of breath but no clinical desaturation.  Patient had some mild tachycardia which did resolve upon resting.  Stable for discharge home at this time.  Will prescribe 5 days of prednisone 40 mg daily and as needed Tessalon Perles.  Will resume home bronchodilator regimen.  Stable for discharge home at this time.    Discharge Diagnoses:  Principal Problem:   Acute respiratory failure with hypoxia (HCC) Active Problems:   Asthma exacerbation   Uncontrolled type 2 diabetes mellitus with hyperglycemia (HCC)   Chronic pain syndrome   Tobacco abuse   Obesity, Class III, BMI 40-49.9 (morbid obesity) (HCC)   Rhinovirus infection   Leukocytosis   Thrush * Acute respiratory failure with hypoxia (HCC) Patient weaned off supplemental oxygen.  On room air at time of discharge.  No desaturation with ambulation.  5  days of prednisone prescribed.  Resume home bronchodilator regimen.   Asthma exacerbation Rhinovirus infection likely the trigger.  Supportive care.  P.o. prednisone prescribed.  40 mg daily.  Can resume home bronchodilator regimen.  Not hypoxic at time of discharge.   Uncontrolled type 2 diabetes mellitus with hyperglycemia (HCC) Hemoglobin A1c 9.1 as outpatient on 12/22/2021.  Resume home regimen at time of discharge.   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   Coded and prolonged intubation   Buspirone Other (See Comments)   Oxycodone    Causes hyperactive feeling and problems resting/sleeping with the 7.5mg  dose, able to take lower dosages ok Causes hyperactive feeling and problems resting/sleeping   Succinimides    Other reaction(s): Unknown        Medication List     STOP taking these medications    cyclobenzaprine 10 MG tablet Commonly known as: FLEXERIL       TAKE these medications    albuterol 108 (90 Base) MCG/ACT inhaler Commonly known as: VENTOLIN HFA Inhale 2 puffs into the lungs every 6 (six) hours as needed for wheezing or shortness of breath.   azelastine 0.1 % nasal spray Commonly known as: ASTELIN Place 1 spray into  both nostrils 2 (two) times daily.   benzonatate 100 MG capsule Commonly known as: Tessalon Perles Take 1 capsule (100 mg total) by mouth 3 (three) times daily as needed for cough.   Budesonide 90 MCG/ACT inhaler Inhale 1 puff into the lungs 2 (two) times daily.   doxycycline 100 MG capsule Commonly  known as: VIBRAMYCIN Take 100 mg by mouth 2 (two) times daily.   DULoxetine 60 MG capsule Commonly known as: CYMBALTA Take 60 mg by mouth daily.   EPINEPHrine 0.3 mg/0.3 mL Soaj injection Commonly known as: EPI-PEN Inject 0.3 mg into the muscle as needed for anaphylaxis.   fluconazole 150 MG tablet Commonly known as: DIFLUCAN Take 150 mg by mouth daily as needed.   fluticasone 50 MCG/ACT nasal spray Commonly known as: FLONASE Place 2 sprays into the nose daily.   gabapentin 300 MG capsule Commonly known as: NEURONTIN Take by mouth.   HumuLIN R U-500 KwikPen 500 UNIT/ML KwikPen Generic drug: insulin regular human CONCENTRATED Inject 10 Units into the skin 3 (three) times daily with meals.   hydrOXYzine 25 MG tablet Commonly known as: ATARAX Take 25 mg by mouth 3 (three) times daily.   loratadine 10 MG tablet Commonly known as: CLARITIN Take 10 mg by mouth 2 (two) times daily.   LORazepam 1 MG tablet Commonly known as: Ativan Take 1 tablet (1 mg total) by mouth daily for 5 days.   morphine 15 MG 12 hr tablet Commonly known as: MS CONTIN Take 15 mg by mouth 2 (two) times daily.   omeprazole 40 MG capsule Commonly known as: PRILOSEC Take 40 mg by mouth 2 (two) times daily.   oxyCODONE-acetaminophen 10-325 MG tablet Commonly known as: PERCOCET Take 1 tablet by mouth 3 (three) times daily.   predniSONE 20 MG tablet Commonly known as: DELTASONE Take 2 tablets (40 mg total) by mouth daily with breakfast for 5 days.   pregabalin 100 MG capsule Commonly known as: LYRICA Take 100-200 mg by mouth 3 (three) times daily.   promethazine 25 MG tablet Commonly known as: PHENERGAN Take 25 mg by mouth every 6 (six) hours as needed for nausea or vomiting.   tiZANidine 2 MG tablet Commonly known as: ZANAFLEX Take 2 mg by mouth 3 (three) times daily as needed for muscle spasms.   Toujeo SoloStar 300 UNIT/ML Solostar Pen Generic drug: insulin glargine (1 Unit  Dial) Inject 30 Units into the skin at bedtime.   traZODone 100 MG tablet Commonly known as: DESYREL Take 100 mg by mouth at bedtime.        Follow-up Information     [place], [facility] Follow up in 5 day(s).   Contact information: [address] [place] [address] [phone]         Jeralyn Ruths, MD Follow up in 1 month(s).   Specialty: Oncology Why: pesistant leukocytosis Contact information: [address] [place] [address] [phone]                Allergies  Allergen Reactions   Amoxicillin-Pot Clavulanate Anaphylaxis    Swelling of lips, tongue, and face   Succinylcholine Other (See Comments)    Unknown  Pseudocholinesterase deficiency  Coded and prolonged intubation-pseudocholinesterase deficiency  Pseudocholinesterase def.   Coded and prolonged intubation   Buspirone Other (See Comments)   Oxycodone     Causes hyperactive feeling and problems resting/sleeping with the 7.5mg  dose, able to take lower dosages ok Causes hyperactive feeling and problems resting/sleeping  Succinimides     Other reaction(s): Unknown    Consultations: None   Procedures/Studies: ECHOCARDIOGRAM COMPLETE  Result Date: 01/31/2022    ECHOCARDIOGRAM REPORT   Patient Name:   Elaine [name] Date of Exam: 01/31/2022 Medical Rec #:  [id number]        Height:       67.0 in Accession #:    [id number]       Weight:       272.5 lb Date of Birth:  [birth year]        BSA:          2.306 m Patient Age:    41 years         BP:           Not listed in chart/Not listed in                                                chart mmHg Patient Gender: F                HR:           Not listed in chart bpm. Exam Location:  [facility] Procedure: 2D Echo, Cardiac Doppler and Color Doppler Indications:     Dyspnea R06.00  History:         Patient has no prior history of Echocardiogram examinations.                  Anxiety.  Sonographer:     Cristela Blue Referring Phys:   [id number] Alford Highland Diagnosing Phys: Lorine Bears MD  Sonographer Comments: Technically challenging study due to limited acoustic windows, no apical window and no subcostal window. IMPRESSIONS  1. Left ventricular ejection fraction, by estimation, is 55 to 60%. The left ventricle has normal function. The left ventricle has no regional wall motion abnormalities. There is mild left ventricular hypertrophy. Left ventricular diastolic function could not be evaluated.  2. Right ventricular systolic function is normal. The right ventricular size is normal. Tricuspid regurgitation signal is inadequate for assessing PA pressure.  3. The mitral valve is normal in structure. No evidence of mitral valve regurgitation. No evidence of mitral stenosis.  4. The aortic valve is normal in structure. Aortic valve regurgitation is not visualized. No aortic stenosis is present.  5. Limited images to review due to lack of apical and subcostal windows. FINDINGS  Left Ventricle: Left ventricular ejection fraction, by estimation, is 55 to 60%. The left ventricle has normal function. The left ventricle has no regional wall motion abnormalities. The left ventricular internal cavity size was normal in size. There is  mild left ventricular hypertrophy. Left ventricular diastolic function could not be evaluated. Right Ventricle: The right ventricular size is normal. No increase in right ventricular wall thickness. Right ventricular systolic function is normal. Tricuspid regurgitation signal is inadequate for assessing PA pressure. Left Atrium: Left atrial size was normal in size. Right Atrium: Right atrial size was normal in size. Pericardium: There is no evidence of pericardial effusion. Mitral Valve: The mitral valve is normal in structure. No evidence of mitral valve regurgitation. No evidence of mitral valve stenosis. Tricuspid Valve: The tricuspid valve is normal in structure. Tricuspid valve regurgitation is not demonstrated. No  evidence of tricuspid stenosis. Aortic Valve: The aortic valve is normal in structure. Aortic valve regurgitation is not visualized.  No aortic stenosis is present. Pulmonic Valve: The pulmonic valve was normal in structure. Pulmonic valve regurgitation is not visualized. No evidence of pulmonic stenosis. Aorta: The aortic root is normal in size and structure. Venous: The inferior vena cava was not well visualized. IAS/Shunts: No atrial level shunt detected by color flow Doppler.  LEFT VENTRICLE PLAX 2D LVIDd:         4.40 cm LVIDs:         3.20 cm LV PW:         1.20 cm LV IVS:        1.30 cm LVOT diam:     2.00 cm LVOT Area:     3.14 cm  LEFT ATRIUM         Index LA diam:    3.30 cm 1.43 cm/m   AORTA Ao Root diam: 3.13 cm  SHUNTS Systemic Diam: 2.00 cm Lorine Bears MD Electronically signed by Lorine Bears MD Signature Date/Time: 01/31/2022/11:17:15 AM    Final    CT Angio Chest Pulmonary Embolism (PE) W or WO Contrast  Result Date: 01/30/2022 CLINICAL DATA:  Shortness of breath EXAM: CT ANGIOGRAPHY CHEST WITH CONTRAST TECHNIQUE: Multidetector CT imaging of the chest was performed using the standard protocol during bolus administration of intravenous contrast. Multiplanar CT image reconstructions and MIPs were obtained to evaluate the vascular anatomy. RADIATION DOSE REDUCTION: This exam was performed according to the departmental dose-optimization program which includes automated exposure control, adjustment of the mA and/or kV according to patient size and/or use of iterative reconstruction technique. CONTRAST:  75mL OMNIPAQUE IOHEXOL 350 MG/ML SOLN COMPARISON:  None Available. FINDINGS: Cardiovascular: Satisfactory opacification of the pulmonary arteries to the segmental level. No evidence of pulmonary embolism. Normal heart size. No pericardial effusion. There is mild reflux of contrast into the hepatic veins, which is nonspecific, in the setting of right heart dysfunction. Mediastinum/Nodes: No  enlarged mediastinal, hilar, or axillary lymph nodes. There are prominent scattered mediastinal lymph nodes for example a 7 mm paratracheal lymph node and a 9 subcarinal node. These are likely reactive. Thyroid gland, trachea, and esophagus demonstrate no significant findings. Lungs/Pleura: Focal consolidative opacity on the dependent portion of the right upper lobe (series 6, image 118) is favored to represent atelectasis. No pleural effusion or pneumothorax. Upper Abdomen: No acute abnormality. Musculoskeletal: No chest wall abnormality. No acute or significant osseous findings. Spinal nerve stimulator leads place. Review of the MIP images confirms the above findings. IMPRESSION: 1. No evidence of pulmonary embolism or acute intrathoracic process. 2. Reflux of contrast into the hepatic veins is nonspecific, but could be seen in the setting of right heart dysfunction. Correlate with echocardiography. Electronically Signed   By: Lorenza Cambridge M.D.   On: 01/30/2022 14:16   DG Chest 2 View  Result Date: 01/26/2022 CLINICAL DATA:  Increasing shortness of breath for several days, hypoxia EXAM: CHEST - 2 VIEW COMPARISON:  01/15/2015 FINDINGS: Frontal and lateral views of the chest demonstrate an unremarkable cardiac silhouette. No airspace disease, effusion, or pneumothorax. No acute bony abnormalities. Spinal stimulator lead within the lower thoracic central canal. IMPRESSION: 1. No acute intrathoracic process. Electronically Signed   By: Sharlet Salina M.D.   On: 01/26/2022 17:21      Subjective: Seen and examined at the time of discharge.  Stable no distress.  Ambulating around nursing unit without desaturation.  Appropriate for discharge home.  Discharge Exam: Vitals:   02/02/22 0541 02/02/22 0810  BP: (!) 156/86 (!) 150/94  Pulse:  90 88  Resp: 20 18  Temp: (!) 97.5 F (36.4 C) 97.6 F (36.4 C)  SpO2: 94% 100%   Vitals:   02/01/22 1929 02/01/22 2248 02/02/22 0541 02/02/22 0810  BP:  (!) 140/91  (!) 156/86 (!) 150/94  Pulse:  (!) 102 90 88  Resp:  20 20 18   Temp:   (!) 97.5 F (36.4 C) 97.6 F (36.4 C)  TempSrc:    Oral  SpO2: 90% 97% 94% 100%  Weight:      Height:        General: Pt is alert, awake, not in acute distress Cardiovascular: RRR, S1/S2 +, no rubs, no gallops Respiratory: CTA bilaterally, no wheezing, no rhonchi Abdominal: Soft, NT, ND, bowel sounds + Extremities: no edema, no cyanosis    The results of significant diagnostics from this hospitalization (including imaging, microbiology, ancillary and laboratory) are listed below for reference.     Microbiology: Recent Results (from the past 240 hour(s))  SARS Coronavirus 2 by RT PCR (hospital order, performed in [facility] hospital lab) *cepheid single result test* Anterior Nasal Swab     Status: None   Collection Time: 01/26/22  5:51 PM   Specimen: Anterior Nasal Swab  Result Value Ref Range Status   SARS Coronavirus 2 by RT PCR NEGATIVE NEGATIVE Final    Comment: (NOTE) SARS-CoV-2 target nucleic acids are NOT DETECTED.  The SARS-CoV-2 RNA is generally detectable in upper and lower respiratory specimens during the acute phase of infection. The lowest concentration of SARS-CoV-2 viral copies this assay can detect is 250 copies / mL. A negative result does not preclude SARS-CoV-2 infection and should not be used as the sole basis for treatment or other patient management decisions.  A negative result may occur with improper specimen collection / handling, submission of specimen other than nasopharyngeal swab, presence of viral mutation(s) within the areas targeted by this assay, and inadequate number of viral copies (<250 copies / mL). A negative result must be combined with clinical observations, patient history, and epidemiological information.  Fact Sheet for Patients:   RoadLapTop.co.zahttps://www.fda.gov/media/158405/download  Fact Sheet for Healthcare  Providers: http://kim-miller.com/https://www.fda.gov/media/158404/download  This test is not yet approved or  cleared by the [place] FDA and has been authorized for detection and/or diagnosis of SARS-CoV-2 by FDA under an Emergency Use Authorization (EUA).  This EUA will remain in effect (meaning this test can be used) for the duration of the COVID-19 declaration under Section 564(b)(1) of the Act, 21 U.S.C. section 360bbb-3(b)(1), unless the authorization is terminated or revoked sooner.  Performed at [facility], [address] [place], [place] [address]   Respiratory (~20 pathogens) panel by PCR     Status: Abnormal   Collection Time: 01/26/22  7:59 PM   Specimen: Nasopharyngeal Swab; Respiratory  Result Value Ref Range Status   Adenovirus NOT DETECTED NOT DETECTED Final   Coronavirus 229E NOT DETECTED NOT DETECTED Final    Comment: (NOTE) The Coronavirus on the Respiratory Panel, DOES NOT test for the novel  Coronavirus (2019 nCoV)    Coronavirus HKU1 NOT DETECTED NOT DETECTED Final   Coronavirus NL63 NOT DETECTED NOT DETECTED Final   Coronavirus OC43 NOT DETECTED NOT DETECTED Final   Metapneumovirus NOT DETECTED NOT DETECTED Final   Rhinovirus / Enterovirus DETECTED (A) NOT DETECTED Final   Influenza A NOT DETECTED NOT DETECTED Final   Influenza B NOT DETECTED NOT DETECTED Final   Parainfluenza Virus 1 NOT DETECTED NOT DETECTED Final   Parainfluenza  Virus 2 NOT DETECTED NOT DETECTED Final   Parainfluenza Virus 3 NOT DETECTED NOT DETECTED Final   Parainfluenza Virus 4 NOT DETECTED NOT DETECTED Final   Respiratory Syncytial Virus NOT DETECTED NOT DETECTED Final   Bordetella pertussis NOT DETECTED NOT DETECTED Final   Bordetella Parapertussis NOT DETECTED NOT DETECTED Final   Chlamydophila pneumoniae NOT DETECTED NOT DETECTED Final   Mycoplasma pneumoniae NOT DETECTED NOT DETECTED Final    Comment: Performed at [facility], [address], [place], [place] [address]      Labs: BNP (last 3 results) Recent Labs    01/26/22 1652  BNP 9.1   Basic Metabolic Panel: Recent Labs  Lab 01/26/22 1652 01/27/22 0545 01/29/22 0818 02/02/22 0156  NA 136 138 139 138  K 4.5 3.9 3.8 4.3  CL 101 107 99 99  CO2 GLUCOSE 245* 208* 124* 301*  BUN CREATININE 0.71 0.63 0.68 0.68  CALCIUM 8.9 7.7* 9.2 9.0   Liver Function Tests: No results for input(s): "AST", "ALT", "ALKPHOS", "BILITOT", "PROT", "ALBUMIN" in the last 168 hours. No results for input(s): "LIPASE", "AMYLASE" in the last 168 hours. No results for input(s): "AMMONIA" in the last 168 hours. CBC: Recent Labs  Lab 01/26/22 1652 01/27/22 0545 01/29/22 0818 02/02/22 0156  WBC 18.2* 24.2* 23.3* 28.1*  NEUTROABS  --  19.2*  --   --   HGB 14.5 14.6 14.0 13.7  HCT 47.2* 49.6* 45.2 44.2  MCV 81.1 82.8 79.6* 79.9*  PLT 191 355 356 343   Cardiac Enzymes: No results for input(s): "CKTOTAL", "CKMB", "CKMBINDEX", "TROPONINI" in the last 168 hours. BNP: Invalid input(s): "POCBNP" CBG: Recent Labs  Lab 02/01/22 1224 02/01/22 1654 02/01/22 2138 02/02/22 0807 02/02/22 1124  GLUCAP 155* 205* 293* 249* 204*   D-Dimer No results for input(s): "DDIMER" in the last 72 hours. Hgb A1c No results for input(s): "HGBA1C" in the last 72 hours. Lipid Profile No results for input(s): "CHOL", "HDL", "LDLCALC", "TRIG", "CHOLHDL", "LDLDIRECT" in the last 72 hours. Thyroid function studies No results for input(s): "TSH", "T4TOTAL", "T3FREE", "THYROIDAB" in the last 72 hours.  Invalid input(s): "FREET3" Anemia work up No results for input(s): "VITAMINB12", "FOLATE", "FERRITIN", "TIBC", "IRON", "RETICCTPCT" in the last 72 hours. Urinalysis    Component Value Date/Time   COLORURINE STRAW (A) 01/15/2015 2227   APPEARANCEUR CLEAR (A) 01/15/2015 2227   APPEARANCEUR Clear 06/12/2014 0707   LABSPEC 1.005 01/15/2015 2227   LABSPEC 1.013 06/12/2014 0707   PHURINE 6.0 01/15/2015 2227    GLUCOSEU NEGATIVE 01/15/2015 2227   GLUCOSEU Negative 06/12/2014 0707   HGBUR 2+ (A) 01/15/2015 2227   BILIRUBINUR NEGATIVE 01/15/2015 2227   BILIRUBINUR Negative 06/12/2014 0707   KETONESUR TRACE (A) 01/15/2015 2227   PROTEINUR NEGATIVE 01/15/2015 2227   NITRITE NEGATIVE 01/15/2015 2227   LEUKOCYTESUR NEGATIVE 01/15/2015 2227   LEUKOCYTESUR Negative 06/12/2014 0707   Sepsis Labs Recent Labs  Lab 01/26/22 1652 01/27/22 0545 01/29/22 0818 02/02/22 0156  WBC 18.2* 24.2* 23.3* 28.1*   Microbiology Recent Results (from the past 240 hour(s))  SARS Coronavirus 2 by RT PCR (hospital order, performed in [facility] hospital lab) *cepheid single result test* Anterior Nasal Swab     Status: None   Collection Time: 01/26/22  5:51 PM   Specimen: Anterior Nasal Swab  Result Value Ref Range Status   SARS Coronavirus 2 by RT PCR NEGATIVE NEGATIVE Final    Comment: (NOTE) SARS-CoV-2 target  nucleic acids are NOT DETECTED.  The SARS-CoV-2 RNA is generally detectable in upper and lower respiratory specimens during the acute phase of infection. The lowest concentration of SARS-CoV-2 viral copies this assay can detect is 250 copies / mL. A negative result does not preclude SARS-CoV-2 infection and should not be used as the sole basis for treatment or other patient management decisions.  A negative result may occur with improper specimen collection / handling, submission of specimen other than nasopharyngeal swab, presence of viral mutation(s) within the areas targeted by this assay, and inadequate number of viral copies (<250 copies / mL). A negative result must be combined with clinical observations, patient history, and epidemiological information.  Fact Sheet for Patients:   RoadLapTop.co.za  Fact Sheet for Healthcare Providers: http://kim-miller.com/  This test is not yet approved or  cleared by the [place] FDA and has been authorized  for detection and/or diagnosis of SARS-CoV-2 by FDA under an Emergency Use Authorization (EUA).  This EUA will remain in effect (meaning this test can be used) for the duration of the COVID-19 declaration under Section 564(b)(1) of the Act, 21 U.S.C. section 360bbb-3(b)(1), unless the authorization is terminated or revoked sooner.  Performed at [facility], [address] [place], [place] [address]   Respiratory (~20 pathogens) panel by PCR     Status: Abnormal   Collection Time: 01/26/22  7:59 PM   Specimen: Nasopharyngeal Swab; Respiratory  Result Value Ref Range Status   Adenovirus NOT DETECTED NOT DETECTED Final   Coronavirus 229E NOT DETECTED NOT DETECTED Final    Comment: (NOTE) The Coronavirus on the Respiratory Panel, DOES NOT test for the novel  Coronavirus (2019 nCoV)    Coronavirus HKU1 NOT DETECTED NOT DETECTED Final   Coronavirus NL63 NOT DETECTED NOT DETECTED Final   Coronavirus OC43 NOT DETECTED NOT DETECTED Final   Metapneumovirus NOT DETECTED NOT DETECTED Final   Rhinovirus / Enterovirus DETECTED (A) NOT DETECTED Final   Influenza A NOT DETECTED NOT DETECTED Final   Influenza B NOT DETECTED NOT DETECTED Final   Parainfluenza Virus 1 NOT DETECTED NOT DETECTED Final   Parainfluenza Virus 2 NOT DETECTED NOT DETECTED Final   Parainfluenza Virus 3 NOT DETECTED NOT DETECTED Final   Parainfluenza Virus 4 NOT DETECTED NOT DETECTED Final   Respiratory Syncytial Virus NOT DETECTED NOT DETECTED Final   Bordetella pertussis NOT DETECTED NOT DETECTED Final   Bordetella Parapertussis NOT DETECTED NOT DETECTED Final   Chlamydophila pneumoniae NOT DETECTED NOT DETECTED Final   Mycoplasma pneumoniae NOT DETECTED NOT DETECTED Final    Comment: Performed at [facility], [address], [place], [place] [address]     Time coordinating discharge: Over 30 minutes  SIGNED:   Tresa [name], MD  [facility] 02/02/2022, 1:35 PM Pager   If  7PM-7AM, please contact night-coverage

## 2022-02-02 NOTE — Progress Notes (Signed)
Mobility Specialist - Progress Note   02/02/22 1229  Mobility  Activity Ambulated independently in hallway  Level of Assistance Independent  Assistive Device None  Distance Ambulated (ft) 320 ft  Activity Response Tolerated well  Mobility Referral Yes  $Mobility charge 1 Mobility   O2 while resting on RA: 96% (HR 100)  O2 while ambulating on RA: 92-94% (HR 110-116)  O2 ?L ambulating on RA: N/A   Pt ambulating in room upon entry, utilizing RA. Pt ambulated one lap around NS without AD, O2 >90% with HR of 110-116 while ambulating. Pt voiced having "a little SOB" during amb, however "able to walk without feeling fatigued." Pt returned to room, left sitting EOB with O2 >92% with HR of 106.   Zetta Bills Mobility Specialist 02/02/22 12:39 PM

## 2022-02-28 ENCOUNTER — Inpatient Hospital Stay: Payer: Medicare Other | Attending: Oncology | Admitting: Oncology

## 2022-04-01 ENCOUNTER — Telehealth: Payer: Self-pay | Admitting: *Deleted

## 2022-04-01 NOTE — Telephone Encounter (Signed)
Nurse placed call to patient to review appointment details for upcoming new patient consultation visit. Patient confirmed appointment time and details. All questions answered. Patient reports he has not been able to get blood cultures drawn due to being sick with GI illness. Discussed that Dr. Grayland Ormond would make recommendations about additional lab work after consultation visit. Patient was also advised by PCP to discuss breast concerns at visit. Nurse advised that mandatory masking is in place and that patient can have 1 support person with her during visit.

## 2022-04-04 ENCOUNTER — Encounter: Payer: Self-pay | Admitting: Oncology

## 2022-04-04 ENCOUNTER — Other Ambulatory Visit: Payer: Self-pay | Admitting: *Deleted

## 2022-04-04 ENCOUNTER — Inpatient Hospital Stay: Payer: BC Managed Care – PPO

## 2022-04-04 ENCOUNTER — Other Ambulatory Visit: Payer: Self-pay

## 2022-04-04 ENCOUNTER — Inpatient Hospital Stay: Payer: BC Managed Care – PPO | Attending: Oncology | Admitting: Oncology

## 2022-04-04 VITALS — BP 140/88 | HR 88 | Temp 99.6°F | Resp 20 | Ht 67.0 in | Wt 257.0 lb

## 2022-04-04 DIAGNOSIS — D72829 Elevated white blood cell count, unspecified: Secondary | ICD-10-CM

## 2022-04-04 DIAGNOSIS — Z1231 Encounter for screening mammogram for malignant neoplasm of breast: Secondary | ICD-10-CM

## 2022-04-04 DIAGNOSIS — F17210 Nicotine dependence, cigarettes, uncomplicated: Secondary | ICD-10-CM | POA: Diagnosis not present

## 2022-04-04 DIAGNOSIS — Z79899 Other long term (current) drug therapy: Secondary | ICD-10-CM | POA: Diagnosis not present

## 2022-04-04 NOTE — Progress Notes (Signed)
[facility]  [phone] Fax:(336) [phone]  ID: Sheppard Evens OB: [birth year]  MR#: [id number]  [id number]  Patient Care Team: [facility] as [facility]CP - General  CHIEF COMPLAINT: Leukocytosis.  INTERVAL HISTORY: Patient is a 42 year old female with multiple, chronic medical complaints who was noted to have a persistently elevated white blood cell count for the last 8 to 10 years.  She has chronic pain, but otherwise feels well.  She has no neurologic complaints.  She denies any recent fevers.  She has a good appetite and denies weight loss.  She was admitted to the hospital approximately 6 weeks ago with asthma exacerbation and rhinovirus infection and still has residual shortness of breath, but denies any chest pain, hemoptysis, or cough.  She has no nausea, vomiting, constipation, or diarrhea.  She has no urinary complaints.  Patient offers no further specific complaints today.  REVIEW OF SYSTEMS:   Review of Systems  Constitutional:  Positive for malaise/fatigue. Negative for fever and weight loss.  Respiratory:  Positive for shortness of breath. Negative for cough and hemoptysis.   Cardiovascular: Negative.  Negative for chest pain and leg swelling.  Gastrointestinal: Negative.  Negative for abdominal pain and heartburn.  Genitourinary: Negative.  Negative for dysuria.  Musculoskeletal:  Positive for back pain, joint pain and neck pain.  Skin: Negative.  Negative for rash.  Neurological:  Positive for weakness. Negative for dizziness, focal weakness and headaches.  Psychiatric/Behavioral: Negative.  The patient is not nervous/anxious.     As per HPI. Otherwise, a complete review of systems is negative.  PAST MEDICAL HISTORY: Past Medical History:  Diagnosis Date   Anxiety    panic attack [id number]   Complication of anesthesia    PONV (postoperative nausea and vomiting)    Pseudocholinesterase deficiency     PAST SURGICAL  HISTORY: Past Surgical History:  Procedure Laterality Date   APPENDECTOMY     BACK SURGERY  2014   CHOLECYSTECTOMY     CYSTOSCOPY N/A 10/09/2015   Procedure: CYSTOSCOPY;  Surgeon: Benjaman Kindler, MD;  Location: [facility];  Service: Gynecology;  Laterality: N/A;   FRACTURE SURGERY Right 1987   leg   LAPAROSCOPIC BILATERAL SALPINGECTOMY Bilateral 10/09/2015   Procedure: LAPAROSCOPIC BILATERAL SALPINGECTOMY;  Surgeon: Benjaman Kindler, MD;  Location: [facility];  Service: Gynecology;  Laterality: Bilateral;   LAPAROSCOPIC HYSTERECTOMY N/A 10/09/2015   Procedure: HYSTERECTOMY TOTAL LAPAROSCOPIC;  Surgeon: Benjaman Kindler, MD;  Location: [facility];  Service: Gynecology;  Laterality: N/A;    FAMILY HISTORY: History reviewed. No pertinent family history.  ADVANCED DIRECTIVES (Y/N):  N  HEALTH MAINTENANCE: Social History   Tobacco Use   Smoking status: Every Day    Packs/day: 1.50    Types: Cigarettes   Smokeless tobacco: Never  Substance Use Topics   Alcohol use: Yes    Alcohol/week: 3.0 standard drinks of alcohol    Types: 3 Glasses of wine per week    Comment: socially   Drug use: No     Colonoscopy:  PAP:  Bone density:  Lipid panel:  Allergies  Allergen Reactions   Amoxicillin-Pot Clavulanate Anaphylaxis    Swelling of lips, tongue, and face   Succinylcholine Other (See Comments)    Unknown  Pseudocholinesterase deficiency  Coded and prolonged intubation-pseudocholinesterase deficiency  Pseudocholinesterase def.   Coded and prolonged intubation   Buspirone Other (See Comments)   Oxycodone     Causes hyperactive feeling and problems resting/sleeping with the 7.44m dose, able to  take lower dosages ok Causes hyperactive feeling and problems resting/sleeping    Succinimides     Other reaction(s): Unknown    Current Outpatient Medications  Medication Sig Dispense Refill   albuterol (VENTOLIN HFA) 108 (90 Base) MCG/ACT inhaler Inhale 2 puffs into the lungs every 6  (six) hours as needed for wheezing or shortness of breath.     azelastine (ASTELIN) 0.1 % nasal spray Place 1 spray into both nostrils 2 (two) times daily.     azithromycin (ZITHROMAX) 250 MG tablet Take 250 mg by mouth once.     Budesonide 90 MCG/ACT inhaler Inhale 1 puff into the lungs 2 (two) times daily.     DULoxetine (CYMBALTA) 60 MG capsule Take 60 mg by mouth daily.     EPINEPHrine 0.3 mg/0.3 mL IJ SOAJ injection Inject 0.3 mg into the muscle as needed for anaphylaxis.     fluticasone (FLONASE) 50 MCG/ACT nasal spray Place 2 sprays into the nose daily.     HUMULIN R U-500 KWIKPEN 500 UNIT/ML KwikPen Inject 10 Units into the skin 3 (three) times daily with meals.     hydrOXYzine (ATARAX) 25 MG tablet Take 25 mg by mouth 3 (three) times daily.     loratadine (CLARITIN) 10 MG tablet Take 10 mg by mouth 2 (two) times daily.     morphine (MS CONTIN) 15 MG 12 hr tablet Take 15 mg by mouth 2 (two) times daily.     omeprazole (PRILOSEC) 40 MG capsule Take 40 mg by mouth 2 (two) times daily.     oxyCODONE-acetaminophen (PERCOCET) 10-325 MG tablet Take 1 tablet by mouth 3 (three) times daily.     OZEMPIC, 0.25 OR 0.5 MG/DOSE, 2 MG/3ML SOPN INJECT 0.375 MLS (0.25 MG TOTAL) SUBCUTANEOUSLY ONCE A WEEK FOR 28 DAYS     predniSONE (DELTASONE) 20 MG tablet Take 20 mg by mouth daily with breakfast.     pregabalin (LYRICA) 100 MG capsule Take 100-200 mg by mouth 3 (three) times daily.     promethazine (PHENERGAN) 25 MG tablet Take 25 mg by mouth every 6 (six) hours as needed for nausea or vomiting.     rizatriptan (MAXALT) 10 MG tablet Take 10 mg by mouth as needed.     tiZANidine (ZANAFLEX) 2 MG tablet Take 2 mg by mouth 3 (three) times daily as needed for muscle spasms.     topiramate (TOPAMAX) 25 MG tablet Take 1 tablet by mouth 2 (two) times daily.     TOUJEO SOLOSTAR 300 UNIT/ML Solostar Pen Inject 30 Units into the skin at bedtime.     traZODone (DESYREL) 100 MG tablet Take 100 mg by mouth at  bedtime.     TRELEGY ELLIPTA 100-62.5-25 MCG/ACT AEPB INHALE 1 PUFF INTO THE LUNGS ONCE DAILY.     benzonatate (TESSALON PERLES) 100 MG capsule Take 1 capsule (100 mg total) by mouth 3 (three) times daily as needed for cough. (Patient not taking: Reported on 04/04/2022) 30 capsule 0   fluconazole (DIFLUCAN) 150 MG tablet Take 150 mg by mouth daily as needed. (Patient not taking: Reported on 04/04/2022)     gabapentin (NEURONTIN) 300 MG capsule Take by mouth. (Patient not taking: Reported on 04/04/2022)     No current facility-administered medications for this visit.    OBJECTIVE: Vitals:   04/04/22 1136  BP: (!) 140/88  Pulse: 88  Resp: 20  Temp: 99.6 F (37.6 C)  SpO2: 97%     Body mass index is 40.25 kg/m.  ECOG FS:1 - Symptomatic but completely ambulatory  General: Well-developed, well-nourished, no acute distress. Eyes: Pink conjunctiva, anicteric sclera. HEENT: Normocephalic, moist mucous membranes. Lungs: No audible wheezing or coughing. Heart: Regular rate and rhythm. Abdomen: Soft, nontender, no obvious distention. Musculoskeletal: No edema, cyanosis, or clubbing. Neuro: Alert, answering all questions appropriately. Cranial nerves grossly intact. Skin: No rashes or petechiae noted. Psych: Normal affect. Lymphatics: No cervical, calvicular, axillary or inguinal LAD.   LAB RESULTS:  Lab Results  Component Value Date   NA 138 02/02/2022   K 4.3 02/02/2022   CL 99 02/02/2022   CO2 31 02/02/2022   GLUCOSE 301 (H) 02/02/2022   BUN 14 02/02/2022   CREATININE 0.68 02/02/2022   CALCIUM 9.0 02/02/2022   PROT 7.9 01/15/2015   ALBUMIN 4.1 01/15/2015   AST 18 01/15/2015   ALT 13 (L) 01/15/2015   ALKPHOS 82 01/15/2015   BILITOT 0.3 01/15/2015   GFRNONAA >60 02/02/2022   GFRAA >60 09/28/2015    Lab Results  Component Value Date   WBC 28.1 (H) 02/02/2022   NEUTROABS 19.2 (H) 01/27/2022   HGB 13.7 02/02/2022   HCT 44.2 02/02/2022   MCV 79.9 (L) 02/02/2022   PLT  343 02/02/2022     STUDIES: No results found.  ASSESSMENT: Leukocytosis.  PLAN:    Leukocytosis: Upon review of patient's chart, her white blood cell count has ranged from 15.3-29.4 since February 2014.  Today's result is pending.  Have ordered peripheral blood flow cytometry and BCR-ABL mutation for completeness.  Patient will have video-assisted telemedicine visit in 3 weeks to discuss the results. Health maintenance: I have ordered screening mammogram to be completed in the next 1 to 2 weeks. Family history: Patient reports her paternal grandmother, aunt, and cousin all have been diagnosed with breast cancer.  Will refer to genetic counseling to see if testing is warranted.  I spent a total of 45 minutes reviewing chart data, face-to-face evaluation with the patient, counseling and coordination of care as detailed above.   Patient expressed understanding and was in agreement with this plan. She also understands that She can call clinic at any time with any questions, concerns, or complaints.    Lloyd Huger, MD   04/04/2022 1:55 PM

## 2022-04-06 ENCOUNTER — Inpatient Hospital Stay: Payer: BC Managed Care – PPO

## 2022-04-06 DIAGNOSIS — D72829 Elevated white blood cell count, unspecified: Secondary | ICD-10-CM | POA: Diagnosis not present

## 2022-04-06 LAB — CBC WITH DIFFERENTIAL/PLATELET
Abs Immature Granulocytes: 0.16 10*3/uL — ABNORMAL HIGH (ref 0.00–0.07)
Basophils Absolute: 0.1 10*3/uL (ref 0.0–0.1)
Basophils Relative: 1 %
Eosinophils Absolute: 0.2 10*3/uL (ref 0.0–0.5)
Eosinophils Relative: 1 %
HCT: 48.1 % — ABNORMAL HIGH (ref 36.0–46.0)
Hemoglobin: 15.1 g/dL — ABNORMAL HIGH (ref 12.0–15.0)
Immature Granulocytes: 1 %
Lymphocytes Relative: 20 %
Lymphs Abs: 4 10*3/uL (ref 0.7–4.0)
MCH: 24 pg — ABNORMAL LOW (ref 26.0–34.0)
MCHC: 31.4 g/dL (ref 30.0–36.0)
MCV: 76.3 fL — ABNORMAL LOW (ref 80.0–100.0)
Monocytes Absolute: 0.9 10*3/uL (ref 0.1–1.0)
Monocytes Relative: 5 %
Neutro Abs: 14.2 10*3/uL — ABNORMAL HIGH (ref 1.7–7.7)
Neutrophils Relative %: 72 %
Platelets: 426 10*3/uL — ABNORMAL HIGH (ref 150–400)
RBC: 6.3 MIL/uL — ABNORMAL HIGH (ref 3.87–5.11)
RDW: 15.9 % — ABNORMAL HIGH (ref 11.5–15.5)
WBC: 19.7 10*3/uL — ABNORMAL HIGH (ref 4.0–10.5)
nRBC: 0 % (ref 0.0–0.2)

## 2022-04-08 LAB — COMP PANEL: LEUKEMIA/LYMPHOMA

## 2022-04-12 ENCOUNTER — Ambulatory Visit: Admission: RE | Admit: 2022-04-12 | Payer: BC Managed Care – PPO | Source: Ambulatory Visit

## 2022-04-14 LAB — BCR-ABL1, CML/ALL, PCR, QUANT: Interpretation (BCRAL):: NEGATIVE

## 2022-04-27 ENCOUNTER — Inpatient Hospital Stay: Payer: BC Managed Care – PPO | Attending: Oncology | Admitting: Licensed Clinical Social Worker

## 2022-04-27 ENCOUNTER — Inpatient Hospital Stay: Payer: BC Managed Care – PPO

## 2022-04-29 ENCOUNTER — Inpatient Hospital Stay: Payer: BC Managed Care – PPO | Admitting: Oncology

## 2022-05-02 NOTE — Progress Notes (Signed)
This encounter was created in error - please disregard.

## 2022-05-17 ENCOUNTER — Inpatient Hospital Stay: Payer: BC Managed Care – PPO | Admitting: Oncology

## 2022-05-19 DIAGNOSIS — M3500 Sicca syndrome, unspecified: Secondary | ICD-10-CM

## 2022-05-19 HISTORY — DX: Sjogren syndrome, unspecified: M35.00

## 2022-06-01 ENCOUNTER — Encounter: Payer: Self-pay | Admitting: Oncology

## 2022-06-01 ENCOUNTER — Inpatient Hospital Stay: Payer: BC Managed Care – PPO | Attending: Oncology | Admitting: Oncology

## 2022-06-01 ENCOUNTER — Inpatient Hospital Stay: Payer: BC Managed Care – PPO

## 2022-06-01 VITALS — BP 123/77 | HR 95 | Temp 98.2°F | Resp 18 | Ht 67.0 in | Wt 243.0 lb

## 2022-06-01 DIAGNOSIS — D72829 Elevated white blood cell count, unspecified: Secondary | ICD-10-CM

## 2022-06-01 DIAGNOSIS — E611 Iron deficiency: Secondary | ICD-10-CM | POA: Insufficient documentation

## 2022-06-01 DIAGNOSIS — F17210 Nicotine dependence, cigarettes, uncomplicated: Secondary | ICD-10-CM | POA: Diagnosis not present

## 2022-06-01 LAB — IRON AND TIBC
Iron: 29 ug/dL (ref 28–170)
Saturation Ratios: 9 % — ABNORMAL LOW (ref 10.4–31.8)
TIBC: 333 ug/dL (ref 250–450)
UIBC: 304 ug/dL

## 2022-06-01 LAB — CBC WITH DIFFERENTIAL/PLATELET
Abs Immature Granulocytes: 0.24 10*3/uL — ABNORMAL HIGH (ref 0.00–0.07)
Basophils Absolute: 0.1 10*3/uL (ref 0.0–0.1)
Basophils Relative: 1 %
Eosinophils Absolute: 0.2 10*3/uL (ref 0.0–0.5)
Eosinophils Relative: 1 %
HCT: 45.1 % (ref 36.0–46.0)
Hemoglobin: 14.3 g/dL (ref 12.0–15.0)
Immature Granulocytes: 1 %
Lymphocytes Relative: 15 %
Lymphs Abs: 3.2 10*3/uL (ref 0.7–4.0)
MCH: 24 pg — ABNORMAL LOW (ref 26.0–34.0)
MCHC: 31.7 g/dL (ref 30.0–36.0)
MCV: 75.7 fL — ABNORMAL LOW (ref 80.0–100.0)
Monocytes Absolute: 1.4 10*3/uL — ABNORMAL HIGH (ref 0.1–1.0)
Monocytes Relative: 7 %
Neutro Abs: 16.1 10*3/uL — ABNORMAL HIGH (ref 1.7–7.7)
Neutrophils Relative %: 75 %
Platelets: 419 10*3/uL — ABNORMAL HIGH (ref 150–400)
RBC: 5.96 MIL/uL — ABNORMAL HIGH (ref 3.87–5.11)
RDW: 18 % — ABNORMAL HIGH (ref 11.5–15.5)
WBC: 21.3 10*3/uL — ABNORMAL HIGH (ref 4.0–10.5)
nRBC: 0 % (ref 0.0–0.2)

## 2022-06-01 LAB — FERRITIN: Ferritin: 57 ng/mL (ref 11–307)

## 2022-06-01 NOTE — Progress Notes (Signed)
[facility]  [phone] Fax:(336) [phone]  ID: Elaine [name] OB: 1981/01/18  MR#: [id number]  PZW#:[id number]  Patient Care Team: [name] as PCP - General  CHIEF COMPLAINT: Leukocytosis.  INTERVAL HISTORY: Patient returns to clinic today for further evaluation and discussion of her laboratory results.  She continues to have chronic pain, but otherwise feels well.  She has no neurologic complaints.  She denies any recent fevers.  She has a good appetite and denies weight loss.  She denies any chest pain, shortness of breath, cough, or hemoptysis.  She has no nausea, vomiting, constipation, or diarrhea.  She has no urinary complaints.  Patient offers no further specific complaints today.  REVIEW OF SYSTEMS:   Review of Systems  Constitutional:  Positive for malaise/fatigue. Negative for fever and weight loss.  Respiratory: Negative.  Negative for cough, hemoptysis and shortness of breath.   Cardiovascular: Negative.  Negative for chest pain and leg swelling.  Gastrointestinal: Negative.  Negative for abdominal pain and heartburn.  Genitourinary: Negative.  Negative for dysuria.  Musculoskeletal:  Positive for back pain, joint pain and neck pain.  Skin: Negative.  Negative for rash.  Neurological:  Positive for weakness. Negative for dizziness, focal weakness and headaches.  Psychiatric/Behavioral: Negative.  The patient is not nervous/anxious.     As per HPI. Otherwise, a complete review of systems is negative.  PAST MEDICAL HISTORY: Past Medical History:  Diagnosis Date   Anxiety    panic attack 12/2014   Complication of anesthesia    PONV (postoperative nausea and vomiting)    Pseudocholinesterase deficiency    Sjogren's syndrome 05/19/2022    PAST SURGICAL HISTORY: Past Surgical History:  Procedure Laterality Date   APPENDECTOMY     BACK SURGERY  2014   CHOLECYSTECTOMY     CYSTOSCOPY N/A 10/09/2015   Procedure:  CYSTOSCOPY;  Surgeon: Christeen Douglas, MD;  Location: [facility];  Service: Gynecology;  Laterality: N/A;   FRACTURE SURGERY Right 1987   leg   LAPAROSCOPIC BILATERAL SALPINGECTOMY Bilateral 10/09/2015   Procedure: LAPAROSCOPIC BILATERAL SALPINGECTOMY;  Surgeon: Christeen Douglas, MD;  Location: [facility];  Service: Gynecology;  Laterality: Bilateral;   LAPAROSCOPIC HYSTERECTOMY N/A 10/09/2015   Procedure: HYSTERECTOMY TOTAL LAPAROSCOPIC;  Surgeon: Christeen Douglas, MD;  Location: [facility];  Service: Gynecology;  Laterality: N/A;    FAMILY HISTORY: History reviewed. No pertinent family history.  ADVANCED DIRECTIVES (Y/N):  N  HEALTH MAINTENANCE: Social History   Tobacco Use   Smoking status: Every Day    Packs/day: 1.5    Types: Cigarettes   Smokeless tobacco: Never  Substance Use Topics   Alcohol use: Yes    Alcohol/week: 3.0 standard drinks of alcohol    Types: 3 Glasses of wine per week    Comment: socially   Drug use: No     Colonoscopy:  PAP:  Bone density:  Lipid panel:  Allergies  Allergen Reactions   Amoxicillin-Pot Clavulanate Anaphylaxis    Swelling of lips, tongue, and face   Succinylcholine Other (See Comments)    Unknown  Pseudocholinesterase deficiency  Coded and prolonged intubation-pseudocholinesterase deficiency  Pseudocholinesterase def.   Coded and prolonged intubation   Buspirone Other (See Comments)   Oxycodone     Causes hyperactive feeling and problems resting/sleeping with the 7.5mg  dose, able to take lower dosages ok Causes hyperactive feeling and problems resting/sleeping    Succinimides     Other reaction(s): Unknown    Current Outpatient Medications  Medication  Sig Dispense Refill   albuterol (VENTOLIN HFA) 108 (90 Base) MCG/ACT inhaler Inhale 2 puffs into the lungs every 6 (six) hours as needed for wheezing or shortness of breath.     azelastine (ASTELIN) 0.1 % nasal spray Place 1 spray into both nostrils 2 (two) times daily.      Budesonide 90 MCG/ACT inhaler Inhale 1 puff into the lungs 2 (two) times daily.     DULoxetine (CYMBALTA) 60 MG capsule Take 60 mg by mouth daily.     EPINEPHrine 0.3 mg/0.3 mL IJ SOAJ injection Inject 0.3 mg into the muscle as needed for anaphylaxis.     fluticasone (FLONASE) 50 MCG/ACT nasal spray Place 2 sprays into the nose daily.     hydrOXYzine (ATARAX) 25 MG tablet Take 25 mg by mouth 3 (three) times daily.     loratadine (CLARITIN) 10 MG tablet Take 10 mg by mouth 2 (two) times daily.     morphine (MS CONTIN) 15 MG 12 hr tablet Take 15 mg by mouth 2 (two) times daily.     omeprazole (PRILOSEC) 40 MG capsule Take 40 mg by mouth 2 (two) times daily.     oxyCODONE-acetaminophen (PERCOCET) 10-325 MG tablet Take 1 tablet by mouth 3 (three) times daily.     OZEMPIC, 0.25 OR 0.5 MG/DOSE, 2 MG/3ML SOPN INJECT 0.375 MLS (0.25 MG TOTAL) SUBCUTANEOUSLY ONCE A WEEK FOR 28 DAYS     pregabalin (LYRICA) 100 MG capsule Take 100-200 mg by mouth 3 (three) times daily.     promethazine (PHENERGAN) 25 MG tablet Take 25 mg by mouth every 6 (six) hours as needed for nausea or vomiting.     rizatriptan (MAXALT) 10 MG tablet Take 10 mg by mouth as needed.     topiramate (TOPAMAX) 25 MG tablet Take 1 tablet by mouth 2 (two) times daily.     traZODone (DESYREL) 100 MG tablet Take 100 mg by mouth at bedtime.     TRELEGY ELLIPTA 100-62.5-25 MCG/ACT AEPB INHALE 1 PUFF INTO THE LUNGS ONCE DAILY.     azithromycin (ZITHROMAX) 250 MG tablet Take 250 mg by mouth once. (Patient not taking: Reported on 06/01/2022)     benzonatate (TESSALON PERLES) 100 MG capsule Take 1 capsule (100 mg total) by mouth 3 (three) times daily as needed for cough. (Patient not taking: Reported on 04/04/2022) 30 capsule 0   fluconazole (DIFLUCAN) 150 MG tablet Take 150 mg by mouth daily as needed. (Patient not taking: Reported on 04/04/2022)     gabapentin (NEURONTIN) 300 MG capsule Take by mouth. (Patient not taking: Reported on 04/04/2022)      HUMULIN R U-500 KWIKPEN 500 UNIT/ML KwikPen Inject 10 Units into the skin 3 (three) times daily with meals. (Patient not taking: Reported on 06/01/2022)     predniSONE (DELTASONE) 20 MG tablet Take 20 mg by mouth daily with breakfast. (Patient not taking: Reported on 06/01/2022)     TOUJEO SOLOSTAR 300 UNIT/ML Solostar Pen Inject 30 Units into the skin at bedtime. (Patient not taking: Reported on 06/01/2022)     No current facility-administered medications for this visit.    OBJECTIVE: Vitals:   06/01/22 1037  BP: 123/77  Pulse: 95  Resp: 18  Temp: 98.2 F (36.8 C)  SpO2: 96%     Body mass index is 38.06 kg/m.    ECOG FS:1 - Symptomatic but completely ambulatory  General: Well-developed, well-nourished, no acute distress. Eyes: Pink conjunctiva, anicteric sclera. HEENT: Normocephalic, moist mucous membranes. Lungs: No audible  wheezing or coughing. Heart: Regular rate and rhythm. Abdomen: Soft, nontender, no obvious distention. Musculoskeletal: No edema, cyanosis, or clubbing. Neuro: Alert, answering all questions appropriately. Cranial nerves grossly intact. Skin: No rashes or petechiae noted. Psych: Normal affect.  LAB RESULTS:  Lab Results  Component Value Date   NA 138 02/02/2022   K 4.3 02/02/2022   CL 99 02/02/2022   CO2 31 02/02/2022   GLUCOSE 301 (H) 02/02/2022   BUN 14 02/02/2022   CREATININE 0.68 02/02/2022   CALCIUM 9.0 02/02/2022   PROT 7.9 01/15/2015   ALBUMIN 4.1 01/15/2015   AST 18 01/15/2015   ALT 13 (L) 01/15/2015   ALKPHOS 82 01/15/2015   BILITOT 0.3 01/15/2015   GFRNONAA >60 02/02/2022   GFRAA >60 09/28/2015    Lab Results  Component Value Date   WBC 21.3 (H) 06/01/2022   NEUTROABS 16.1 (H) 06/01/2022   HGB 14.3 06/01/2022   HCT 45.1 06/01/2022   MCV 75.7 (L) 06/01/2022   PLT 419 (H) 06/01/2022   Lab Results  Component Value Date   IRON 29 06/01/2022   TIBC 333 06/01/2022   IRONPCTSAT 9 (L) 06/01/2022   Lab Results  Component Value  Date   FERRITIN 57 06/01/2022     STUDIES: No results found.  ASSESSMENT: Leukocytosis.  PLAN:    Leukocytosis: Upon review of patient's chart, her white blood cell count has ranged from 15.3-29.4 since February 2014.  Her total white blood cell count today is 21.3.  Previously, peripheral blood flow cytometry and BCR-ABL mutation are negative.  Patient does not require bone marrow biopsy.  No intervention is needed.  No follow-up is necessary.  Please refer patient back if there are any questions or concerns.   Decreased iron stores: Patient has a normal hemoglobin, but noted to have a decreased iron saturation ratio of 9%.  No intervention is needed.  Recommend oral iron supplementation.  I spent a total of 20 minutes reviewing chart data, face-to-face evaluation with the patient, counseling and coordination of care as detailed above.    Patient expressed understanding and was in agreement with this plan. She also understands that She can call clinic at any time with any questions, concerns, or complaints.    Jeralyn Ruthsimothy J Dimas Scheck, MD   06/01/2022 2:45 PM

## 2022-06-01 NOTE — Progress Notes (Signed)
Follow up from missed video visit and was also was told to follow up due to recent labs being elevated from rheumatology on 4/2. Elevated WBC and neutrophil count

## 2022-11-13 IMAGING — CT CT L SPINE W/O CM
3 of 5 series · 14 of 33 positions shown, 16 images · non-contrast
Comparison: 11/15/2019

CLINICAL DATA: Post lumbar fusion with spinal stimulator. Leg
numbness.



[Series 4: sag bone · sagittal · 0.41mm/px · 5 of 117 slices shown]
[im 20/117  bone]
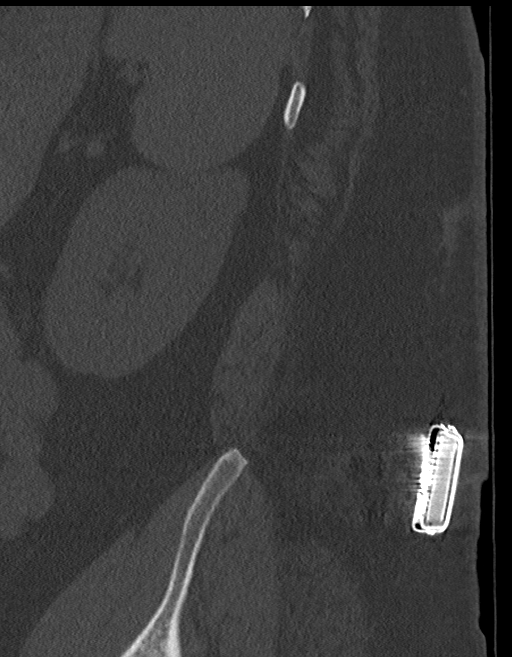
[im 39/117  bone]
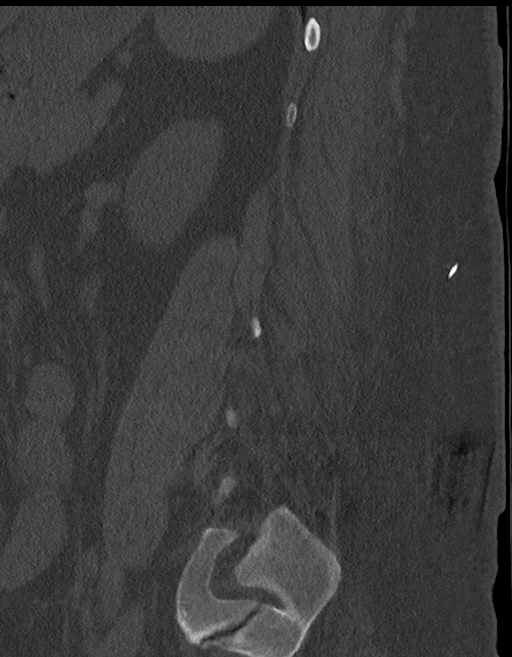
[im 59/117  bone]
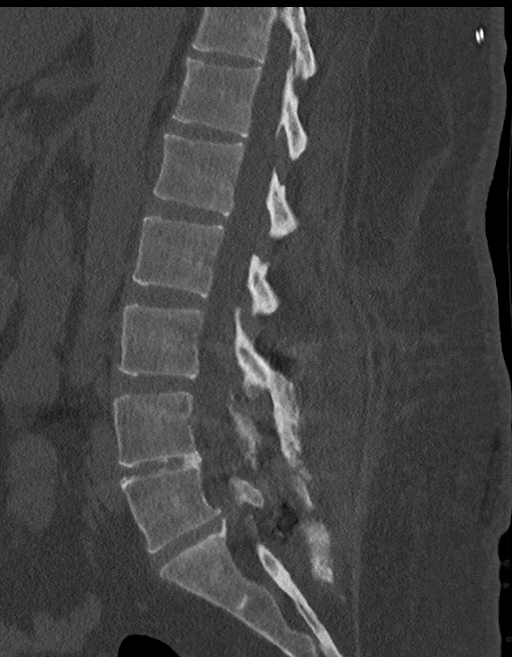
[im 78/117  bone]
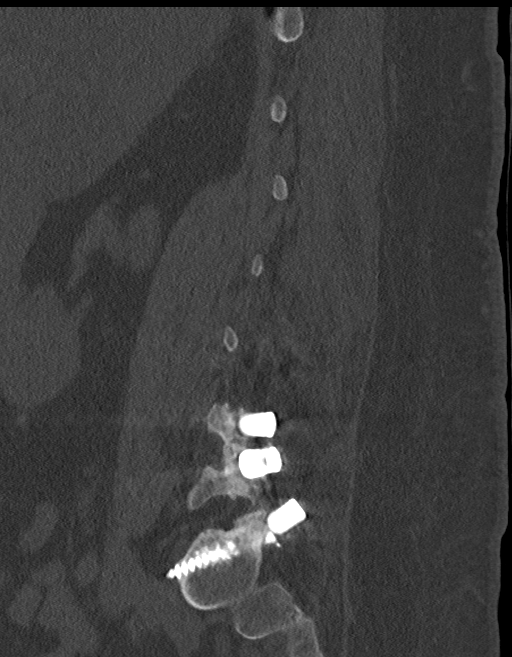
[im 97/117  bone]
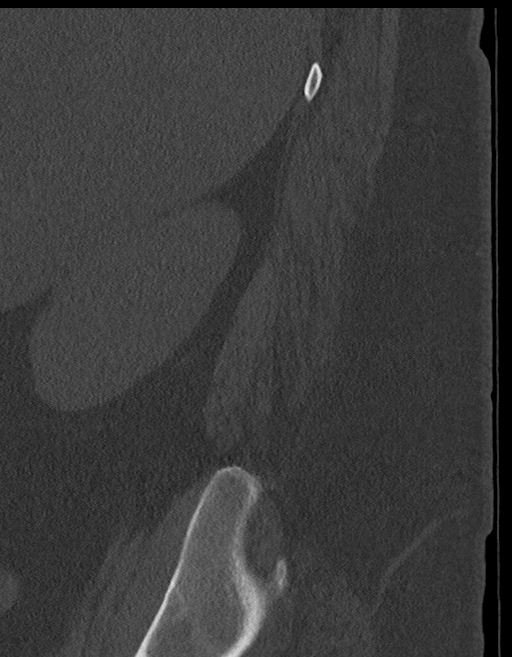

[Series 5: l spine soft · axial · 0.39mm/px · z∈[-1105,-883]mm · 8 of 133 slices shown, 10 images]
[im 11/133  soft-tissue]
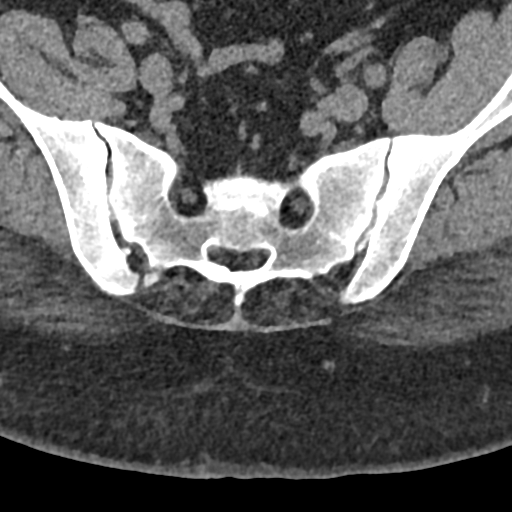
[im 11/133  bone]
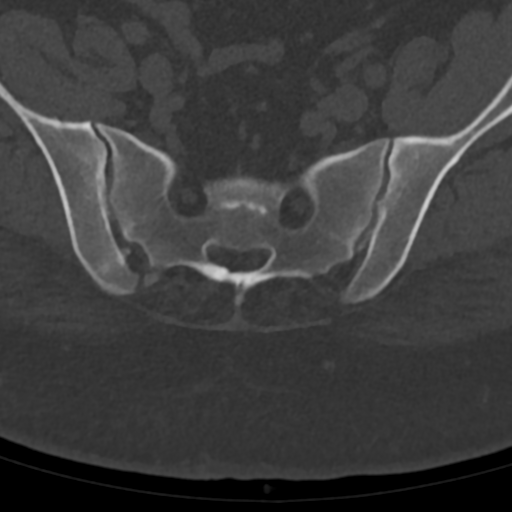
[im 31/133  bone]
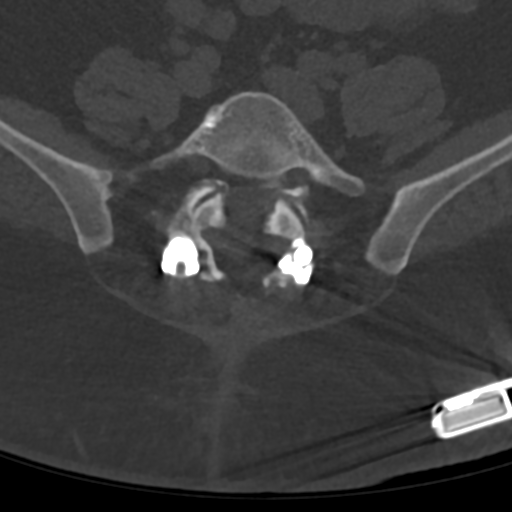
[im 41/133  bone]
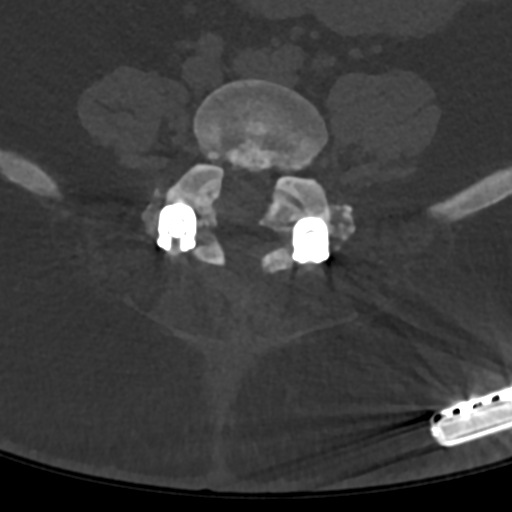
[im 61/133  bone]
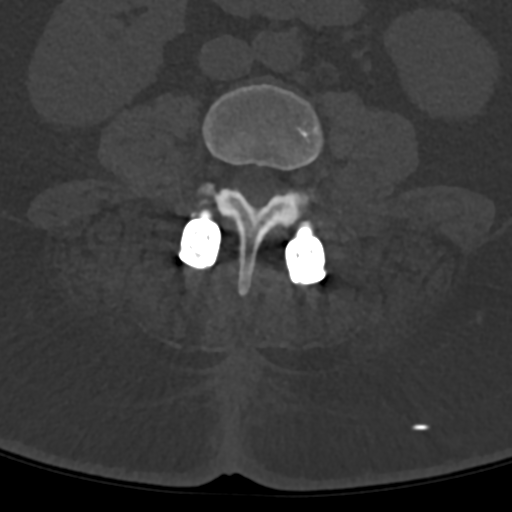
[im 72/133  soft-tissue]
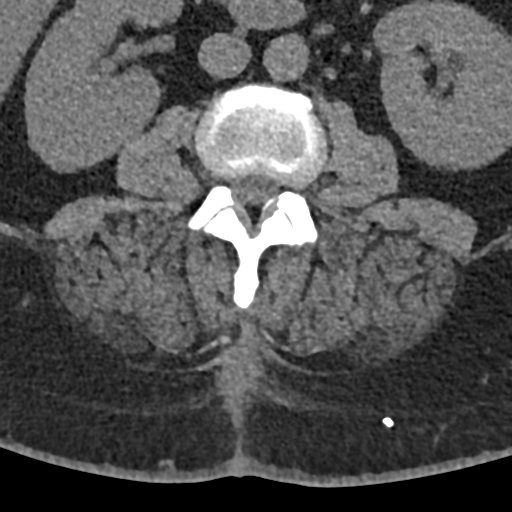
[im 72/133  bone]
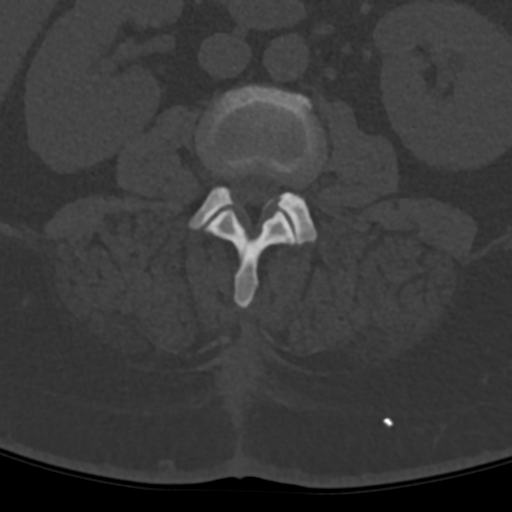
[im 92/133  bone]
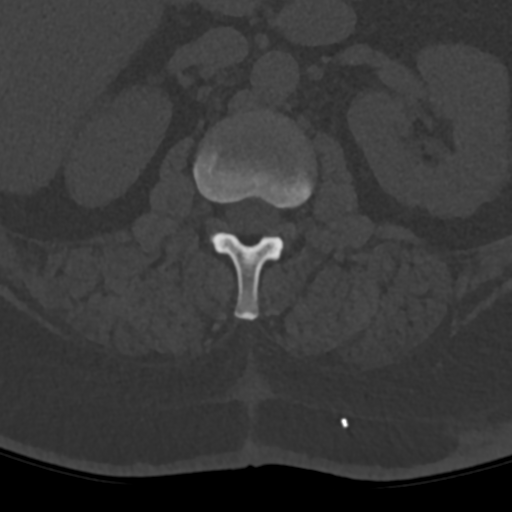
[im 102/133  bone]
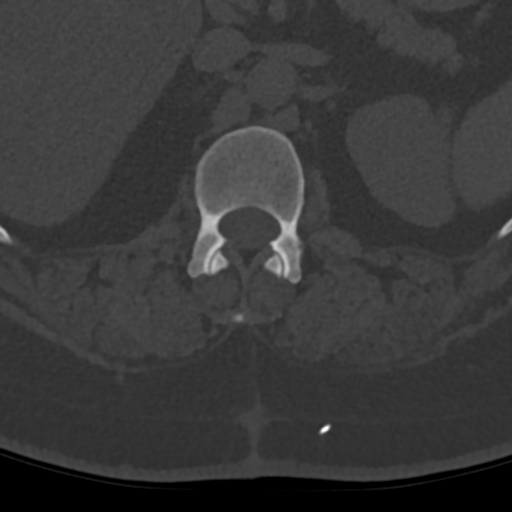
[im 122/133  bone]
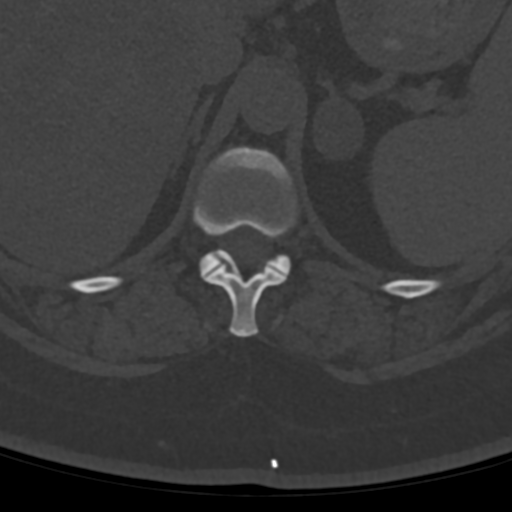

[Series 6: cor bone · coronal · 0.46mm/px · 1 of 105 slices shown]
[im 53/105  bone]
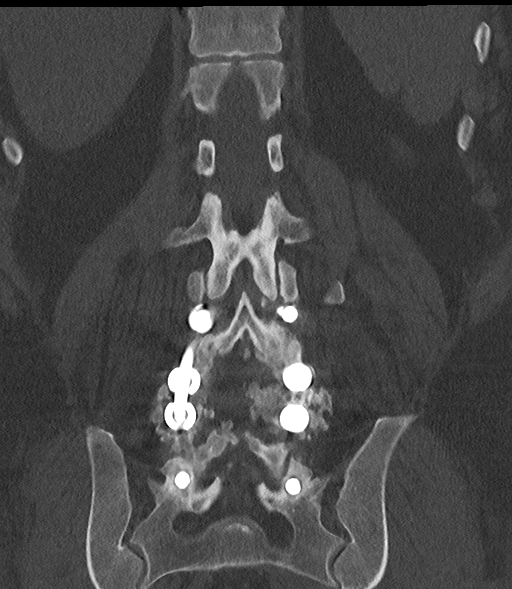

[14 of 33 positions shown; findings below may reference images not displayed]

FINDINGS: Segmentation: 5 lumbar type vertebral bodies.

Alignment: Normal alignment.

Vertebrae: No vertebral compression deformities. No focal bone
lesion or bone destruction. Postoperative changes with posterior
laminectomies at L4-5 and L5-S1. Posterior rod and screw fixation
from L3 to the sacrum. Solid bone fusion is demonstrated
posteriorly. Surgical hardware appears intact. No lucencies at the
bone hardware interfaces.

Paraspinal and other soft tissues: Scarring along the midline
posteriorly consistent with postoperative change. No abnormal
paraspinal soft tissue mass or infiltration. 2.5 cm diameter left
adrenal gland nodule measuring 9 Hounsfield units in density
consistent with benign adenoma. No change since the prior study. No
follow-up imaging is indicated. Generator pack in the subcutaneous
soft tissues over the left flank. Lead tips extend towards the
thoracic spine but are not entirely included.

Disc levels:

T11-12: No significant central stenosis.

T12-L1: No significant central stenosis.

L1-2: No significant central stenosis. Mild diffuse bulging disc
annulus.

L2-3: Mild diffuse bulging disc annulus with mild effacement of the
anterior thecal sac. No significant central stenosis.

L3-4: Suggestion of moderate central stenosis with diffuse bulging
disc annulus. No significant neural foraminal encroachment.

L4-5: Patent central canal post laminectomy. Mild bone encroachment
upon the neural foramina bilaterally due to osteophyte formation.

L5-S1: Bilateral neural foraminal encroachment is present, greater
on the left, caused by osteophyte formation. No significant central
stenosis.
IMPRESSION: 1. Normal alignment.
2. Postoperative lower laminectomies with posterior fixation from L3
to the sacrum. Surgical hardware appears intact with bone fusion
demonstrated.
3. Multilevel degenerative changes as above.

## 2023-12-28 ENCOUNTER — Emergency Department
Admission: EM | Admit: 2023-12-28 | Discharge: 2023-12-28 | Disposition: A | Discharge status: Home / Self Care | Attending: Emergency Medicine | Admitting: Emergency Medicine

## 2023-12-28 ENCOUNTER — Emergency Department

## 2023-12-28 ENCOUNTER — Other Ambulatory Visit: Payer: Self-pay

## 2023-12-28 DIAGNOSIS — R10A2 Flank pain, left side: Secondary | ICD-10-CM | POA: Insufficient documentation

## 2023-12-28 DIAGNOSIS — R112 Nausea with vomiting, unspecified: Secondary | ICD-10-CM | POA: Diagnosis not present

## 2023-12-28 DIAGNOSIS — R8290 Unspecified abnormal findings in urine: Secondary | ICD-10-CM

## 2023-12-28 DIAGNOSIS — M79605 Pain in left leg: Secondary | ICD-10-CM | POA: Insufficient documentation

## 2023-12-28 DIAGNOSIS — R8289 Other abnormal findings on cytological and histological examination of urine: Secondary | ICD-10-CM | POA: Insufficient documentation

## 2023-12-28 DIAGNOSIS — E119 Type 2 diabetes mellitus without complications: Secondary | ICD-10-CM | POA: Diagnosis not present

## 2023-12-28 LAB — CBC
HCT: 49.6 % — ABNORMAL HIGH (ref 36.0–46.0)
Hemoglobin: 15.7 g/dL — ABNORMAL HIGH (ref 12.0–15.0)
MCH: 27.8 pg (ref 26.0–34.0)
MCHC: 31.7 g/dL (ref 30.0–36.0)
MCV: 87.9 fL (ref 80.0–100.0)
Platelets: 151 K/uL (ref 150–400)
RBC: 5.64 MIL/uL — ABNORMAL HIGH (ref 3.87–5.11)
RDW: 15.9 % — ABNORMAL HIGH (ref 11.5–15.5)
WBC: 23.3 K/uL — ABNORMAL HIGH (ref 4.0–10.5)
nRBC: 0 % (ref 0.0–0.2)

## 2023-12-28 LAB — BASIC METABOLIC PANEL WITH GFR
Anion gap: 14 (ref 5–15)
BUN: 9 mg/dL (ref 6–20)
CO2: 21 mmol/L — ABNORMAL LOW (ref 22–32)
Calcium: 9.1 mg/dL (ref 8.9–10.3)
Chloride: 99 mmol/L (ref 98–111)
Creatinine, Ser: 0.79 mg/dL (ref 0.44–1.00)
GFR, Estimated: >60 mL/min (ref >60)
Glucose, Bld: 231 mg/dL — ABNORMAL HIGH (ref 70–99)
Potassium: 4.9 mmol/L (ref 3.5–5.1)
Sodium: 134 mmol/L — ABNORMAL LOW (ref 135–145)

## 2023-12-28 LAB — URINALYSIS, ROUTINE W REFLEX MICROSCOPIC
Bacteria, UA: NONE SEEN
Bilirubin Urine: NEGATIVE
Glucose, UA: NEGATIVE mg/dL
Hgb urine dipstick: NEGATIVE
Ketones, ur: NEGATIVE mg/dL
Leukocytes,Ua: NEGATIVE
Nitrite: POSITIVE — AB
Protein, ur: 30 mg/dL — AB
Specific Gravity, Urine: 1.024 (ref 1.005–1.030)
pH: 5 (ref 5.0–8.0)

## 2023-12-28 LAB — POC URINE PREG, ED: Preg Test, Ur: NEGATIVE

## 2023-12-28 MED ORDER — HYDROMORPHONE HCL 1 MG/ML IJ SOLN
1.0000 mg | Freq: Once | INTRAMUSCULAR | Status: AC
  Administered 2023-12-28: 1 mg via INTRAVENOUS
  Filled 2023-12-28: qty 1

## 2023-12-28 MED ORDER — PROMETHAZINE HCL 25 MG/ML IJ SOLN
12.5000 mg | Freq: Once | INTRAMUSCULAR | Status: AC
  Administered 2023-12-28: 12.5 mg via INTRAVENOUS
  Filled 2023-12-28: qty 12.5

## 2023-12-28 MED ORDER — CEPHALEXIN 500 MG PO CAPS: 500.0000 mg | ORAL_CAPSULE | Freq: Four times a day (QID) | ORAL | 0 refills | Status: AC

## 2023-12-28 MED ORDER — KETOROLAC TROMETHAMINE 15 MG/ML IJ SOLN
15.0000 mg | Freq: Once | INTRAMUSCULAR | Status: AC
  Administered 2023-12-28: 15 mg via INTRAVENOUS
  Filled 2023-12-28: qty 1

## 2023-12-28 MED ORDER — SODIUM CHLORIDE 0.9 % IV BOLUS
1000.0000 mL | Freq: Once | INTRAVENOUS | Status: AC
  Administered 2023-12-28: 1000 mL via INTRAVENOUS

## 2023-12-28 NOTE — Discharge Instructions (Addendum)
 You are seen in the emergency room today for evaluation of your flank pain and urinary symptoms.  Your CT did not show a kidney stone.  As we discussed your urine showed some signs of an infection.  I sent a prescription for antibiotics to your pharmacy.  Follow with your primary care doctor for further evaluation.  Return to the ER for new or worsening symptoms.

## 2023-12-28 NOTE — ED Triage Notes (Signed)
 Left flank pain x 3 days. Hx kidney stones. STarted vomiting today.  States urine stream has been less.

## 2023-12-28 NOTE — ED Provider Notes (Signed)
 [facility] Provider Note    Event Date/Time   First MD Initiated Contact with Patient 12/28/23 1501     (approximate)   History   Flank Pain   HPI  Elaine [name] is a 43 year old female with history of chronic back pain on opioids, T2DM, prior kidney stones presenting to the emergency room for evaluation of left leg pain.  Patient reports over the past 3 days she has had ongoing left flank pain.  Ports associated nausea with vomiting today.  Additionally reports some urinary discomfort over the past several days.    Physical Exam   Triage Vital Signs: ED Triage Vitals  Encounter Vitals Group     BP 12/28/23 1322 (!) 156/98     Girls Systolic BP Percentile --      Girls Diastolic BP Percentile --      Boys Systolic BP Percentile --      Boys Diastolic BP Percentile --      Pulse Rate 12/28/23 1322 (!) 128     Resp 12/28/23 1322 20     Temp 12/28/23 1322 98 F (36.7 C)     Temp Source 12/28/23 1546 Oral     SpO2 12/28/23 1322 97 %     Weight 12/28/23 1323 242 lb 15.2 oz (110.2 kg)     Height --      Head Circumference --      Peak Flow --      Pain Score 12/28/23 1323 7     Pain Loc --      Pain Education --      Exclude from Growth Chart --     Most recent vital signs: Vitals:   12/28/23 1800 12/28/23 1830  BP: 127/65 (!) 115/59  Pulse: (!) 102 (!) 102  Resp:  20  Temp:  99.1 F (37.3 C)  SpO2: 98% 97%     General: Awake, interactive  CV:  Good peripheral perfusion Resp:  Unlabored respirations Abd:  Nondistended, soft, no CVA tenderness to palpation, there is some tenderness to palpation over the left flank Neuro:  Symmetric facial movement, fluid speech   ED Results / Procedures / Treatments   Labs (all labs ordered are listed, but only abnormal results are displayed) Labs Reviewed  URINALYSIS, ROUTINE W REFLEX MICROSCOPIC - Abnormal; Notable for the following components:      Result Value   Color, Urine AMBER (*)     APPearance CLEAR (*)    Protein, ur 30 (*)    Nitrite POSITIVE (*)    All other components within normal limits  CBC - Abnormal; Notable for the following components:   WBC 23.3 (*)    RBC 5.64 (*)    Hemoglobin 15.7 (*)    HCT 49.6 (*)    RDW 15.9 (*)    All other components within normal limits  BASIC METABOLIC PANEL WITH GFR - Abnormal; Notable for the following components:   Sodium 134 (*)    CO2 21 (*)    Glucose, Bld 231 (*)    All other components within normal limits  URINE CULTURE  POC URINE PREG, ED     EKG EKG independently reviewed and interpreted by myself demonstrates:    RADIOLOGY Imaging independently reviewed and interpreted by myself demonstrates:  CT stone study without evidence of renal stone or other acute abnormality  Formal Radiology Read:  CT Renal Stone Study Result Date: 12/28/2023 CLINICAL DATA:  Abdominal and flank pain EXAM:  CT ABDOMEN AND PELVIS WITHOUT CONTRAST TECHNIQUE: Multidetector CT imaging of the abdomen and pelvis was performed following the standard protocol without IV contrast. RADIATION DOSE REDUCTION: This exam was performed according to the departmental dose-optimization program which includes automated exposure control, adjustment of the mA and/or kV according to patient size and/or use of iterative reconstruction technique. COMPARISON:  11/03/2015, 05/04/2021 FINDINGS: Lower chest: No acute pleural or parenchymal lung disease. Hepatobiliary: Prior cholecystectomy. Unremarkable unenhanced appearance of the liver. No biliary duct dilation. Pancreas: Unremarkable unenhanced appearance. Spleen: Unremarkable unenhanced appearance. Adrenals/Urinary Tract: There is a 2.2 cm hypodense left adrenal mass measuring -3 HU, previously measuring 2.1 cm, most consistent with adenoma. Right adrenal is unremarkable. No urinary tract calculi or obstructive uropathy within either kidney. The bladder is decompressed, limiting its evaluation. Stomach/Bowel:  No bowel obstruction or ileus. Prior appendectomy. No bowel wall thickening or inflammatory change. Vascular/Lymphatic: Aortic atherosclerosis. No enlarged abdominal or pelvic lymph nodes. Reproductive: Status post hysterectomy. No adnexal masses. Other: No free fluid or free intraperitoneal gas. No abdominal wall hernia. Musculoskeletal: No acute or destructive bony abnormalities. Spinal stimulator lead within the thoracic central canal at the T9 level. Postsurgical changes from prior L4-5 laminectomy and posterior fusion spanning L3 through S1. Reconstructed images demonstrate no additional findings. IMPRESSION: 1. No acute intra-abdominal or intrapelvic process. No evidence of urinary tract calculi or obstructive uropathy. 2. Left adrenal adenoma. 3.  Aortic Atherosclerosis (ICD10-I70.0). Electronically Signed   By: Ozell Daring M.D.   On: 12/28/2023 15:29    PROCEDURES:  Critical Care performed: No  Procedures   MEDICATIONS ORDERED IN ED: Medications  HYDROmorphone  (DILAUDID ) injection 1 mg (has no administration in time range)  HYDROmorphone  (DILAUDID ) injection 1 mg (1 mg Intravenous Given 12/28/23 1542)  ketorolac  (TORADOL ) 15 MG/ML injection 15 mg (15 mg Intravenous Given 12/28/23 1542)  sodium chloride  0.9 % bolus 1,000 mL (0 mLs Intravenous Stopped 12/28/23 1847)  promethazine  (PHENERGAN ) 12.5 mg in sodium chloride  0.9 % 50 mL IVPB (0 mg Intravenous Stopped 12/28/23 1651)     IMPRESSION / MDM / ASSESSMENT AND PLAN / ED COURSE  I reviewed the triage vital signs and the nursing notes.  Differential diagnosis includes, but is not limited to, renal colic, UTI, pyelonephritis, lower suspicion other acute intra-abdominal process given overall reassuring abdominal exam, exacerbation of chronic pain  Patient's presentation is most consistent with acute presentation with potential threat to life or bodily function.  43 year old female presenting to the emergency department for evaluation of  flank pain.  Significant tachycardia on presentation, improved after pain medication and IV fluids.  Does have significant leukocytosis here, but on review of prior values, this does appear to be chronic.  Patient reports she has seen oncology for this previously without identifiable cause.  Urinalysis here without evidence of bacteria, but interestingly is positive for nitrites.  Urine culture sent.  CT stone study without acute findings.  Patient reassessed.  Continues to report some urinary symptoms and flank pain on reassessment.  While her urine is not typical for a UTI, as it is nitrite positive and she is having symptoms, we did discuss empiric treatment and patient is agreeable.  With her flank pain we will go ahead we will plan for a longer course to treat possible pyelonephritis though CT without evidence of this.  Will DC with 10-day course of Keflex.  Has penicillin allergy listed, but has tolerated Keflex in the past.  Discussed this plan with patient and she is comfortable.  Strict return precautions provided.  Patient discharged stable condition.     FINAL CLINICAL IMPRESSION(S) / ED DIAGNOSES   Final diagnoses:  Left flank pain  Abnormal urinalysis     Rx / DC Orders   ED Discharge Orders          Ordered    cephALEXin (KEFLEX) 500 MG capsule  4 times daily        12/28/23 1921             Note:  This document was prepared using Dragon voice recognition software and may include unintentional dictation errors.   Levander Slate, MD 12/28/23 [phone]

## 2023-12-29 LAB — URINE CULTURE: Culture: <10000 — AB

## 2024-01-06 ENCOUNTER — Emergency Department

## 2024-01-06 ENCOUNTER — Emergency Department
Admission: EM | Admit: 2024-01-06 | Discharge: 2024-01-06 | Disposition: A | Discharge status: Home / Self Care | Attending: Emergency Medicine | Admitting: Emergency Medicine

## 2024-01-06 ENCOUNTER — Encounter: Payer: Self-pay | Admitting: Intensive Care

## 2024-01-06 ENCOUNTER — Other Ambulatory Visit: Payer: Self-pay

## 2024-01-06 DIAGNOSIS — N83291 Other ovarian cyst, right side: Secondary | ICD-10-CM | POA: Diagnosis not present

## 2024-01-06 DIAGNOSIS — R197 Diarrhea, unspecified: Secondary | ICD-10-CM | POA: Diagnosis not present

## 2024-01-06 DIAGNOSIS — E119 Type 2 diabetes mellitus without complications: Secondary | ICD-10-CM | POA: Diagnosis not present

## 2024-01-06 DIAGNOSIS — K769 Liver disease, unspecified: Secondary | ICD-10-CM | POA: Diagnosis not present

## 2024-01-06 DIAGNOSIS — R109 Unspecified abdominal pain: Secondary | ICD-10-CM

## 2024-01-06 DIAGNOSIS — E278 Other specified disorders of adrenal gland: Secondary | ICD-10-CM

## 2024-01-06 DIAGNOSIS — N949 Unspecified condition associated with female genital organs and menstrual cycle: Secondary | ICD-10-CM

## 2024-01-06 DIAGNOSIS — R1011 Right upper quadrant pain: Secondary | ICD-10-CM | POA: Diagnosis present

## 2024-01-06 DIAGNOSIS — R112 Nausea with vomiting, unspecified: Secondary | ICD-10-CM

## 2024-01-06 HISTORY — DX: Type 2 diabetes mellitus without complications: E11.9

## 2024-01-06 LAB — COMPREHENSIVE METABOLIC PANEL WITH GFR
ALT: 17 U/L (ref 0–44)
AST: 21 U/L (ref 15–41)
Albumin: 4.2 g/dL (ref 3.5–5.0)
Alkaline Phosphatase: 98 U/L (ref 38–126)
Anion gap: 14 (ref 5–15)
BUN: 8 mg/dL (ref 6–20)
CO2: 20 mmol/L — ABNORMAL LOW (ref 22–32)
Calcium: 9.5 mg/dL (ref 8.9–10.3)
Chloride: 100 mmol/L (ref 98–111)
Creatinine, Ser: 0.72 mg/dL (ref 0.44–1.00)
GFR, Estimated: >60 mL/min (ref >60)
Glucose, Bld: 204 mg/dL — ABNORMAL HIGH (ref 70–99)
Potassium: 3.9 mmol/L (ref 3.5–5.1)
Sodium: 134 mmol/L — ABNORMAL LOW (ref 135–145)
Total Bilirubin: 0.5 mg/dL (ref 0.0–1.2)
Total Protein: 7.9 g/dL (ref 6.5–8.1)

## 2024-01-06 LAB — URINALYSIS, ROUTINE W REFLEX MICROSCOPIC
Bacteria, UA: NONE SEEN
Bilirubin Urine: NEGATIVE
Glucose, UA: NEGATIVE mg/dL
Hgb urine dipstick: NEGATIVE
Ketones, ur: NEGATIVE mg/dL
Nitrite: NEGATIVE
Protein, ur: NEGATIVE mg/dL
Specific Gravity, Urine: 1.02 (ref 1.005–1.030)
pH: 5 (ref 5.0–8.0)

## 2024-01-06 LAB — CBC
HCT: 44.6 % (ref 36.0–46.0)
Hemoglobin: 15 g/dL (ref 12.0–15.0)
MCH: 28.1 pg (ref 26.0–34.0)
MCHC: 33.6 g/dL (ref 30.0–36.0)
MCV: 83.7 fL (ref 80.0–100.0)
Platelets: 403 K/uL — ABNORMAL HIGH (ref 150–400)
RBC: 5.33 MIL/uL — ABNORMAL HIGH (ref 3.87–5.11)
RDW: 15.1 % (ref 11.5–15.5)
WBC: 27.8 K/uL — ABNORMAL HIGH (ref 4.0–10.5)
nRBC: 0 % (ref 0.0–0.2)

## 2024-01-06 LAB — LIPASE, BLOOD: Lipase: 19 U/L (ref 11–51)

## 2024-01-06 MED ORDER — DROPERIDOL 2.5 MG/ML IJ SOLN
1.2500 mg | Freq: Once | INTRAMUSCULAR | Status: AC
  Administered 2024-01-06: 1.25 mg via INTRAVENOUS
  Filled 2024-01-06: qty 2

## 2024-01-06 MED ORDER — NICOTINE 7 MG/24HR TD PT24
7.0000 mg | MEDICATED_PATCH | Freq: Once | TRANSDERMAL | Status: DC
  Filled 2024-01-06: qty 1

## 2024-01-06 MED ORDER — SODIUM CHLORIDE 0.9 % IV BOLUS
1000.0000 mL | Freq: Once | INTRAVENOUS | Status: AC
  Administered 2024-01-06: 1000 mL via INTRAVENOUS

## 2024-01-06 MED ORDER — DIPHENHYDRAMINE HCL 50 MG/ML IJ SOLN
50.0000 mg | Freq: Once | INTRAMUSCULAR | Status: AC
  Administered 2024-01-06: 50 mg via INTRAVENOUS
  Filled 2024-01-06: qty 1

## 2024-01-06 MED ORDER — ONDANSETRON 4 MG PO TBDP: 4.0000 mg | ORAL_TABLET | Freq: Three times a day (TID) | ORAL | 0 refills | Status: AC | PRN

## 2024-01-06 MED ORDER — IOHEXOL 300 MG/ML  SOLN
100.0000 mL | Freq: Once | INTRAMUSCULAR | Status: AC | PRN
  Administered 2024-01-06: 100 mL via INTRAVENOUS

## 2024-01-06 NOTE — Discharge Instructions (Addendum)
 Please make sure to follow-up with your primary care doctor for further management of the incidental findings noted on your CAT scan.  Please be sure to keep your self hydrated.  I prescribed some Zofran  that you can take every 8 hours as needed for nausea vomiting.

## 2024-01-06 NOTE — ED Triage Notes (Signed)
 C/o abdominal pain with N/V/D X4 days. Patient reports when having a BM, she feels a discomfort in right side of abdomen and unable to sleep on right side due to tenderness  Seen at pain clinic for previous back injury

## 2024-01-06 NOTE — ED Provider Notes (Signed)
 [facility] Provider Note    Event Date/Time   First MD Initiated Contact with Patient 01/06/24 1616     (approximate)   History   Abdominal Pain   HPI  Elaine [name] is a 43 y.o. female show anxiety, diabetes, Sjogren syndrome, chronic pain, presenting with nausea vomiting diarrhea as well as right upper abdominal pain.  States that it started 4 days ago.  Denies any urinary send symptoms or fever.  No fever or chest pain, no urinary symptoms.  No recent antibiotic use or hospitalizations.  States that she has a gallbladder taken out previously.    On independent chart review, she presented with flank pain in early November, had a CT Noncon that did not find any acute intra-abdominal pathology.  Physical Exam   Triage Vital Signs: ED Triage Vitals  Encounter Vitals Group     BP 01/06/24 1556 (!) 148/97     Girls Systolic BP Percentile --      Girls Diastolic BP Percentile --      Boys Systolic BP Percentile --      Boys Diastolic BP Percentile --      Pulse Rate 01/06/24 1556 (!) 127     Resp 01/06/24 1556 20     Temp 01/06/24 1556 98.8 F (37.1 C)     Temp Source 01/06/24 1556 Oral     SpO2 01/06/24 1556 96 %     Weight 01/06/24 1557 215 lb (97.5 kg)     Height 01/06/24 1557 5' 7 (1.702 m)     Head Circumference --      Peak Flow --      Pain Score 01/06/24 1557 6     Pain Loc --      Pain Education --      Exclude from Growth Chart --     Most recent vital signs: Vitals:   01/06/24 1556 01/06/24 1713  BP: (!) 148/97 (!) 146/86  Pulse: (!) 127 (!) 102  Resp: 20 20  Temp: 98.8 F (37.1 C) 98.4 F (36.9 C)  SpO2: 96% 97%     General: Awake, no distress.  CV:  Good peripheral perfusion.  Resp:  Normal effort.  Abd:  No distention.  Soft, tender right upper quadrant without guarding Other:  Nontoxic appearing, moving all 4 extremities without focal weakness   ED Results / Procedures / Treatments   Labs (all labs ordered  are listed, but only abnormal results are displayed) Labs Reviewed  COMPREHENSIVE METABOLIC PANEL WITH GFR - Abnormal; Notable for the following components:      Result Value   Sodium 134 (*)    CO2 20 (*)    Glucose, Bld 204 (*)    All other components within normal limits  CBC - Abnormal; Notable for the following components:   WBC 27.8 (*)    RBC 5.33 (*)    Platelets 403 (*)    All other components within normal limits  URINALYSIS, ROUTINE W REFLEX MICROSCOPIC - Abnormal; Notable for the following components:   Color, Urine YELLOW (*)    APPearance CLOUDY (*)    Leukocytes,Ua SMALL (*)    All other components within normal limits  LIPASE, BLOOD     EKG  EKG shows, sinus tachycardia, rate 103, normal QS, normal QTc, no obvious ischemic ST elevation, not significantly compared to prior   RADIOLOGY On my independent interpretation, CT did not show an obvious colitis   PROCEDURES:  Critical Care performed: No  Procedures   MEDICATIONS ORDERED IN ED: Medications  nicotine  (NICODERM CQ  - dosed in mg/24 hr) patch 7 mg (has no administration in time range)  sodium chloride  0.9 % bolus 1,000 mL (1,000 mLs Intravenous New Bag/Given 01/06/24 1732)  droperidol (INAPSINE) 2.5 MG/ML injection 1.25 mg (1.25 mg Intravenous Given 01/06/24 1732)  iohexol  (OMNIPAQUE ) 300 MG/ML solution 100 mL (100 mLs Intravenous Contrast Given 01/06/24 1720)  diphenhydrAMINE  (BENADRYL ) injection 50 mg (50 mg Intravenous Given 01/06/24 1748)     IMPRESSION / MDM / ASSESSMENT AND PLAN / ED COURSE  I reviewed the triage vital signs and the nursing notes.                              Differential diagnosis includes, but is not limited to, colitis, diverticulitis, gastroenteritis, viral illness, electrolyte derangements, dehydration, IBS, IBD, retained stone, nephrolithiasis.  Get labs, EKG, UA, CT, IV fluids.  Will get an EKG to assess QTc prior to IV droperidol.  Patient's presentation is most  consistent with acute presentation with potential threat to life or bodily function.  Independent interpretation of labs and imaging below.  Clinical course as below.  Discussed with patient about imaging and lab results including incidental findings.  On reassessment she is feeling a lot better.  Discussed with her about keeping herself hydrated, discussed about following up with her primary care doctor outpatient to get reassessed next week.  Will give her a prescription for Zofran  that she can take as needed for her nausea.  Considered but no indication for inpatient admission at this time, she is safe for outpatient management.  Will discharge with strict return precautions.    Clinical Course as of 01/06/24 1849  Sat Jan 06, 2024  1654 Independent review of labs, electrolytes not severely deranged, LFTs are normal, she has leukocytosis but this appears to be chronic, lipase is normal. [TT]  1753 CT ABDOMEN PELVIS W CONTRAST IMPRESSION: 1. Stable left adrenal mass, likely consistent with an adrenal adenoma. 2. Evidence of prior cholecystectomy, appendectomy and hysterectomy. 3. 2.0 cm diameter simple right adnexal cyst, likely ovarian in origin. No follow-up imaging is recommended. This recommendation follows ACR consensus guidelines: White Paper of the ACR Incidental Findings Committee II on Adnexal Findings. J Am Coll Radiol [phone]. 4. Postoperative changes within the mid and lower lumbar spine. 5. Adjacent 4 mm and 6 mm ill-defined foci of parenchymal low attenuation within the posteromedial aspect of the right lobe of the liver. These are too small to characterize and may represent small cysts or hemangiomas. 6. Aortic atherosclerosis.   [TT]  1753 Was given droperidol, started feel anxious, will give her some Benadryl  here. [TT]  1848 Urinalysis, Routine w reflex microscopic -Urine, Clean Catch(!) UA is dirty catch, small leukocytes, 6-10 WBCs but no bacteria, she does  not have any urinary symptoms at this time, doubt UTI. [TT]    Clinical Course User Index [TT] Waymond Lorelle Cummins, MD     FINAL CLINICAL IMPRESSION(S) / ED DIAGNOSES   Final diagnoses:  Abdominal pain, unspecified abdominal location  Nausea vomiting and diarrhea  Adrenal mass  Adnexal cyst  Liver lesion     Rx / DC Orders   ED Discharge Orders          Ordered    ondansetron  (ZOFRAN -ODT) 4 MG disintegrating tablet  Every 8 hours PRN        01/06/24 1847  Note:  This document was prepared using Dragon voice recognition software and may include unintentional dictation errors.    Waymond Lorelle Cummins, MD 01/06/24 [phone]
